# Patient Record
Sex: Male | Born: 1970 | Race: White | Hispanic: No | Marital: Single | Smoking: Current every day smoker
Health system: Southern US, Community
[De-identification: ages and names within clinical notes are randomized; demographics above are authoritative.]

---

## 1997-09-26 ENCOUNTER — Emergency Department (HOSPITAL_COMMUNITY): Admission: EM | Admit: 1997-09-26 | Discharge: 1997-09-26 | Payer: Self-pay | Admitting: Emergency Medicine

## 1997-10-07 ENCOUNTER — Ambulatory Visit (HOSPITAL_BASED_OUTPATIENT_CLINIC_OR_DEPARTMENT_OTHER): Admission: RE | Admit: 1997-10-07 | Discharge: 1997-10-07 | Payer: Self-pay | Admitting: Otolaryngology

## 1997-11-14 ENCOUNTER — Emergency Department (HOSPITAL_COMMUNITY): Admission: EM | Admit: 1997-11-14 | Discharge: 1997-11-14 | Payer: Self-pay | Admitting: Emergency Medicine

## 1998-12-10 ENCOUNTER — Encounter: Payer: Self-pay | Admitting: Emergency Medicine

## 1998-12-10 ENCOUNTER — Emergency Department (HOSPITAL_COMMUNITY): Admission: EM | Admit: 1998-12-10 | Discharge: 1998-12-10 | Payer: Self-pay | Admitting: Emergency Medicine

## 1998-12-12 ENCOUNTER — Emergency Department (HOSPITAL_COMMUNITY): Admission: EM | Admit: 1998-12-12 | Discharge: 1998-12-12 | Payer: Self-pay | Admitting: Emergency Medicine

## 1998-12-13 ENCOUNTER — Encounter: Payer: Self-pay | Admitting: Emergency Medicine

## 1998-12-13 ENCOUNTER — Ambulatory Visit (HOSPITAL_COMMUNITY): Admission: RE | Admit: 1998-12-13 | Discharge: 1998-12-13 | Payer: Self-pay | Admitting: Emergency Medicine

## 1999-12-23 ENCOUNTER — Inpatient Hospital Stay (HOSPITAL_COMMUNITY): Admission: EM | Admit: 1999-12-23 | Discharge: 1999-12-27 | Payer: Self-pay | Admitting: *Deleted

## 2000-01-03 ENCOUNTER — Emergency Department (HOSPITAL_COMMUNITY): Admission: EM | Admit: 2000-01-03 | Discharge: 2000-01-03 | Payer: Self-pay | Admitting: Emergency Medicine

## 2000-02-20 ENCOUNTER — Encounter: Payer: Self-pay | Admitting: Emergency Medicine

## 2000-02-20 ENCOUNTER — Emergency Department (HOSPITAL_COMMUNITY): Admission: EM | Admit: 2000-02-20 | Discharge: 2000-02-20 | Payer: Self-pay | Admitting: Emergency Medicine

## 2000-03-05 ENCOUNTER — Encounter: Payer: Self-pay | Admitting: Emergency Medicine

## 2000-03-05 ENCOUNTER — Emergency Department (HOSPITAL_COMMUNITY): Admission: EM | Admit: 2000-03-05 | Discharge: 2000-03-05 | Payer: Self-pay | Admitting: Emergency Medicine

## 2002-10-16 ENCOUNTER — Emergency Department (HOSPITAL_COMMUNITY): Admission: EM | Admit: 2002-10-16 | Discharge: 2002-10-16 | Payer: Self-pay | Admitting: Emergency Medicine

## 2002-10-16 ENCOUNTER — Encounter: Payer: Self-pay | Admitting: Emergency Medicine

## 2004-08-21 ENCOUNTER — Emergency Department (HOSPITAL_COMMUNITY): Admission: EM | Admit: 2004-08-21 | Discharge: 2004-08-21 | Payer: Self-pay | Admitting: Emergency Medicine

## 2005-03-11 ENCOUNTER — Emergency Department (HOSPITAL_COMMUNITY): Admission: EM | Admit: 2005-03-11 | Discharge: 2005-03-11 | Payer: Self-pay | Admitting: Family Medicine

## 2006-09-27 ENCOUNTER — Inpatient Hospital Stay (HOSPITAL_COMMUNITY): Admission: AC | Admit: 2006-09-27 | Discharge: 2006-09-28 | Payer: Self-pay

## 2007-01-16 ENCOUNTER — Emergency Department (HOSPITAL_COMMUNITY): Admission: EM | Admit: 2007-01-16 | Discharge: 2007-01-16 | Payer: Self-pay | Admitting: Emergency Medicine

## 2007-02-19 ENCOUNTER — Emergency Department (HOSPITAL_COMMUNITY): Admission: EM | Admit: 2007-02-19 | Discharge: 2007-02-19 | Payer: Self-pay | Admitting: Emergency Medicine

## 2007-04-20 ENCOUNTER — Emergency Department (HOSPITAL_COMMUNITY): Admission: EM | Admit: 2007-04-20 | Discharge: 2007-04-20 | Payer: Self-pay | Admitting: Emergency Medicine

## 2007-04-20 ENCOUNTER — Emergency Department (HOSPITAL_COMMUNITY): Admission: EM | Admit: 2007-04-20 | Discharge: 2007-04-21 | Payer: Self-pay | Admitting: Emergency Medicine
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
## 2010-07-06 NOTE — Discharge Summary (Signed)
[facility]  Patient:    [name], [name]                          MRN: [id number] Adm. Date:  [id number] Disc. Date: [id number] Attending:  Hilario Quarry Dictator:   Johnella Moloney, N.P.                           Discharge Summary  HISTORY OF PRESENT ILLNESS:  [name] is a 40 year old white married male voluntarily admitted to the [facility] on December 23, 1999 for depression and suicidal ideation.  The patient reported that he was having suicidal ideation.  He reports that he was drinking heavily on Thursday.  On his way home he got a DUI in the company car.  He was very depressed about this situation and was considering killing himself.  On the day of admission, the patient was in the woods and was considering killing shooting himself but felt because of his two sons that he could not this.  He reports his sleep has decreased over the past few nights, appetite good.  The patient does admit that he drinks on and off with months of sobriety.  Last drink was at 1 a.m. on Thursday night.  He has questionable history of DTs.  He reports some shaking episodes in the past, no seizures.  He does have a history of blackouts.  The patient is adamant about not starting on any medications for depression or his bipolar illness.  PAST PSYCHIATRIC HISTORY:  The patient has a history of bipolar and manic depression diagnosed four years ago.  He sees Dr. Bearl Mulberry in [place].  The patient was placed on medication; however he took himself off of it about a year ago because of the sexual side effects and he does not want to resume any type of medication at this time.  Past hospitalizations include [facility] and [facility] about three to five years ago for psychotic behavior.  PAST MEDICAL HISTORY:  Primary care physician: None.  Medical problems: None. Admission medications: None.  Allergies: No known drug allergies.  PHYSICAL EXAMINATION:  The patient  refused to have a physical examination on the day of admission.  MENTAL STATUS EXAMINATION ON ADMISSION:  Alert, young white male casually dressed, neat, pleasant, cooperative, good eye contact.  Speech: Normal and relevant.  Mood: Depressed.  Affect: Flat.  Thought processes: Intact with no evidence of psychosis.  Denied further suicidal ideation, negative homicidal ideation, no auditory or visual hallucinations, no flight of ideas, no delusions.  Cognitive: Intact.  Average intelligence.  Memory: Good. Judgment: Poor.  Insight: Fair.  Poor impulse control.  ADMITTING DIAGNOSES: Axis I:    Bipolar disorder with major depression. Axis II:   Deferred. Axis III:  None. Axis IV:   Moderate with occupational problems, problems related to the legal            system, and other psychosocial problems, history of alcohol use. Axis V:    Current global assessment of functioning 50, highest in the past            year 70.  LABORATORY DATA:  CBC with differential was within normal limits with the exception of RDW high at 14.3.  CMET: Within normal limits with the exception of albumin slightly decreased at 3.4, ALT high at 65, amylase elevated at 205. T4 and TSH were within normal limits,  T3 uptake was elevated at 40.3.  Urine drug screen was positive for marijuana.  Urinalysis was within normal limits.  HOSPITAL COURSE:  The patient was admitted to the [facility].  He was ordered Seroquel 25 mg p.o. q.4-6h. p.r.n. as needed, Blistex for his cold sore.  He was also ordered Librium 25 mg p.o. q.4h. p.r.n. for any signs and symptoms of withdrawal and also we added Seroquel 25 mg h.s. p.o.  While in the hospital, it was noted that he relapsed into drinking and that he got a DWI after two years of sobriety and this prompted this admission.  He had been stable on lithium and Depakote in the past but no longer wanted to take it and so he stopped his medication.  The second day, he was  doing better.  He slept good with the Seroquel.  He did report the Seroquel helped relax him.  There were no side effects noted.  Appetite was good.  He was having no withdrawal symptoms.  He denied depression.  He denied suicidal ideation or intent. Again, he said he did not want to take Depakote or lithium or a mood stabilizer.  He states his last episode of mania was four years ago.  He did acknowledge depression after drinking last Thursday; however, he feels like he is good shape.  He does not want a family session with anyone.  He prefers to deal with this on his own.  On November 7 he was doing well.  His mood were affect were bright, no withdrawal symptoms.  The family session was to occur November 8.  He does not believe he has a bipolar disorder and does not want to be on any mood stabilizing medication.  We continued the Seroquel as well as discussing his discharge.  On November 8 he was reporting doing well, had only mild withdrawal symptoms but appeared calm and euthymic, sleeping fairly well, no suicidal thoughts.  The patient was confronted about his marijuana use on the unit.  He said it was given to him by another patient.  He refused to identify the peer.  The patient was to have a marital session and then will be discharged after that.  It was felt like he could be managed on an outpatient basis and that he denied any suicidal or homicidal thoughts.  CONDITION ON DISCHARGE:  The patient was discharged in improved condition with improvement in mood, sleep, and appetite, no suicidal or homicidal ideation, no difficulty with energy.  DISPOSITION:  The patient was discharged home.  FOLLOWUP:  [facility], Wednesday, November 14 at 1:30 p.m. with Dr. Lang Snow and emergency services.  DISCHARGE MEDICATIONS:  Seroquel 25 mg on tablet at bedtime.  FINAL DIAGNOSES: Axis I:    1. Bipolar disorder.            2. Major depression. Axis II:    Deferred. Axis III:  None. Axis IV:   Moderate with occupational problems, problems related to the legal            system, and history of alcohol use.  Axis V:    Global assessment of functioning on discharge 59, highest in the            past year 70. DD:  01/30/00 TD:  01/30/00 Job: [id number]

## 2010-07-06 NOTE — H&P (Signed)
[facility]  Patient:    [name], [name]                             MRN: [id number] Adm. Date:  12/23/99 Attending:  Francis Dowse A. Claudette Head, M.D. Dictator:   Candi Leash. Theressa Stamps, N.P.                   Psychiatric Admission Assessment  DATE OF ADMISSION:  December 23, 1999.  PATIENT IDENTIFICATION:  This is a 40 year old white married male voluntarily admitted to [facility] on December 23, 1999 for depression and suicidal ideation.  HISTORY OF PRESENT ILLNESS:  This client is here for suicidal ideation.  The patient reports that he was drinking heavily on Thursday.  On his way home he got a DUI in the company car.  He was very depressed about this situation and was considering killing himself.  On the day of admission, the patient was in the woods and was considering shooting himself but he felt because of his two sons that he could not do this.  He reports his sleeping has been decreased over the past few nights.  His appetite has been good.  The patient does admit that he drinks on and off with months of sobriety.  His last drink was at 1 a.m. on Thursday night.  He has questionable history of DTs.  He reports some shaking episodes in the past, no seizures.  He does have history of blackouts and eye openers.  The patient is very adamant about not starting on any medications for depressions or his bipolar.  PAST PSYCHIATRIC HISTORY:  The patient has a history of bipolar manic depression diagnosed about four years ago.  He was on lithium and Depakote and he sees Dr. Bearl Mulberry in [place].  The patient had taken himself off his medications about a year ago because of sexual side effects and, as stated, the patient does not want to resume any type of medications at this time.  He has had hospitalizations at [facility] and at [facility] about three to five years ago for psychotic behavior.  PAST MEDICAL HISTORY:  Primary care Sendy Pluta: He has none.  Medical  problems: No medical problems.  Psychiatric problems: History of bipolar, as stated. The patient states that he takes care of his problems by diversional activities and using multivitamins and has been off medications for about a year.  Medications: None.  Drug allergies: None.  SOCIAL HISTORY:  This is a 40 year old white married male, married for the past three years.  He has two sons from a previous marriage.  He has a stepson that is 10 years old.  He is an Personnel officer.  He has a 10th grade education. He has no financial or legal problems although he did get a DUI on Thursday evening.  He lives with his wife and 32 year old and he sees his other two children every other weekend.  Family history: No psychiatric problems that he is aware of.  Alcohol and drug history: He has smoked one pack a day for about 10 years.  His alcohol habits include months of sobriety but on Thursday he states he drank about 20 beers and four glasses of wine and, again, received a DUI on his way home.  He does smoke marijuana about every three days.  PHYSICAL EXAMINATION:  Physical examination is pending.  Labs are pending: He will have a urinalysis, urine drug screen, CMET,  thyroid, CBC, and serum amylase to be drawn.  MENTAL STATUS EXAMINATION:  Alert, young white male casually dressed, neat, pleasant, cooperative, good eye contact.  Speech is normal and relevant.  Mood is depressed.  Affect is flat.  Thought processes are intact with no evidence of psychosis.  He denies any current suicidal ideation, negative homicidal ideation, negative auditory or visual hallucinations, negative flight of ideas, negative delusions.  Cognitively, he is intact x 3.  Average intelligence.  Memory is good.  Judgment is poor.  Insight is fair.  Poor impulse control.  ADMISSION DIAGNOSES: Axis I:    Bipolar disorder with major depression. Axis II:   Deferred. Axis III:  None. Axis IV:   Moderate with occupational  problems, problems related to the legal            system, other psychosocial problems, history of alcohol use. Axis V:    Current is 50, past year is 16.  INITIAL PLAN OF CARE:  Voluntary admission to [facility] for depression and suicidal ideation.  The patient is to contract for safety. Check every 15 minutes.  The patient was started on Seroquel 25 mg q.4-6h. His labs are pending.  Routing standing medications will be initiated.  ESTIMATED LENGTH OF STAY:  Three to four days. DD:  12/24/99 TD:  12/24/99 Job: [id number]

## 2010-12-03 LAB — CBC
HCT: 45.1
HCT: 46.5
Hemoglobin: 15.6
Hemoglobin: 15.9
MCHC: 34.7
MCV: 90.2
MCV: 90.7
Platelets: 281
Platelets: 321
RBC: 5
RBC: 5.13
RDW: 14.1 — ABNORMAL HIGH
WBC: 15.9 — ABNORMAL HIGH

## 2010-12-03 LAB — I-STAT 8, (EC8 V) (CONVERTED LAB)
Acid-Base Excess: 1
BUN: 11
Bicarbonate: 26.8 — ABNORMAL HIGH
Chloride: 103
Glucose, Bld: 129 — ABNORMAL HIGH
HCT: 52
Hemoglobin: 17.7 — ABNORMAL HIGH
Operator id: 284251
Potassium: 3.4 — ABNORMAL LOW
Sodium: 137
TCO2: 28
pCO2, Ven: 43.6 — ABNORMAL LOW
pH, Ven: 7.397 — ABNORMAL HIGH

## 2010-12-03 LAB — TYPE AND SCREEN
ABO/RH(D): A POS
Antibody Screen: NEGATIVE

## 2010-12-03 LAB — PROTIME-INR
INR: 0.9
Prothrombin Time: 12.7

## 2012-08-05 ENCOUNTER — Ambulatory Visit: Payer: Self-pay | Admitting: Family Medicine

## 2012-08-05 VITALS — BP 151/83 | HR 79 | Temp 98.0°F | Resp 18 | Ht 72.0 in | Wt 205.0 lb

## 2012-08-05 DIAGNOSIS — M436 Torticollis: Secondary | ICD-10-CM

## 2012-08-05 MED ORDER — PREDNISONE 20 MG PO TABS: ORAL_TABLET | ORAL | Status: DC

## 2012-08-05 MED ORDER — HYDROCODONE-ACETAMINOPHEN 5-325 MG PO TABS: 1.0000 | ORAL_TABLET | Freq: Four times a day (QID) | ORAL | Status: DC | PRN

## 2012-08-05 NOTE — Patient Instructions (Signed)
Torticollis, Acute °You have suddenly (acutely) developed a twisted neck (torticollis). This is usually a self-limited condition. °CAUSES  °Acute torticollis may be caused by malposition, trauma or infection. Most commonly, acute torticollis is caused by sleeping in an awkward position. Torticollis may also be caused by the flexion, extension or twisting of the neck muscles beyond their normal position. Sometimes, the exact cause may not be known. °SYMPTOMS  °Usually, there is pain and limited movement of the neck. Your neck may twist to one side. °DIAGNOSIS  °The diagnosis is often made by physical examination. X-rays, CT scans or MRIs may be done if there is a history of trauma or concern of infection. °TREATMENT  °For a common, stiff neck that develops during sleep, treatment is focused on relaxing the contracted neck muscle. Medications (including shots) may be used to treat the problem. Most cases resolve in several days. Torticollis usually responds to conservative physical therapy. If left untreated, the shortened and spastic neck muscle can cause deformities in the face and neck. Rarely, surgery is required. °HOME CARE INSTRUCTIONS  °· Use over-the-counter and prescription medications as directed by your caregiver. °· Do stretching exercises and massage the neck as directed by your caregiver. °· Follow up with physical therapy if needed and as directed by your caregiver. °SEEK IMMEDIATE MEDICAL CARE IF:  °· You develop difficulty breathing or noisy breathing (stridor). °· You drool, develop trouble swallowing or have pain with swallowing. °· You develop numbness or weakness in the hands or feet. °· You have changes in speech or vision. °· You have problems with urination or bowel movements. °· You have difficulty walking. °· You have a fever. °· You have increased pain. °MAKE SURE YOU:  °· Understand these instructions. °· Will watch your condition. °· Will get help right away if you are not doing well or  get worse. °Document Released: 02/02/2000 Document Revised: 04/29/2011 Document Reviewed: 03/15/2009 °[employer]® Patient Information ©2014 [employer]. ° °
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
## 2012-08-07 ENCOUNTER — Ambulatory Visit: Payer: Self-pay

## 2012-08-07 ENCOUNTER — Ambulatory Visit: Payer: Self-pay | Admitting: Family Medicine

## 2012-08-07 ENCOUNTER — Telehealth: Payer: Self-pay

## 2012-08-07 VITALS — BP 122/75 | HR 70 | Temp 98.5°F | Resp 16 | Ht 73.0 in | Wt 210.0 lb

## 2012-08-07 DIAGNOSIS — M542 Cervicalgia: Secondary | ICD-10-CM

## 2012-08-07 DIAGNOSIS — M436 Torticollis: Secondary | ICD-10-CM

## 2012-08-07 MED ORDER — PREDNISONE 20 MG PO TABS: ORAL_TABLET | ORAL | Status: DC

## 2012-08-07 MED ORDER — HYDROCODONE-ACETAMINOPHEN 5-325 MG PO TABS: 1.0000 | ORAL_TABLET | Freq: Four times a day (QID) | ORAL | Status: DC | PRN

## 2012-08-07 MED ORDER — HYDROCODONE-ACETAMINOPHEN 7.5-325 MG PO TABS: 1.0000 | ORAL_TABLET | Freq: Four times a day (QID) | ORAL | Status: DC | PRN

## 2012-08-07 NOTE — Telephone Encounter (Signed)
Thanks, I have called patient to advise. Left message for him to advise.

## 2012-08-07 NOTE — Telephone Encounter (Signed)
[name] would like to know how long his neck pain should last he is still in a lot of pain from his visit the other day please call him at [phone]

## 2012-08-07 NOTE — Telephone Encounter (Signed)
I had expected some improvement by now.  Have patient return for x-rays
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
## 2012-08-07 NOTE — Telephone Encounter (Signed)
Please advise 

## 2014-02-21 ENCOUNTER — Emergency Department (HOSPITAL_COMMUNITY)
Admission: EM | Admit: 2014-02-21 | Discharge: 2014-02-21 | Disposition: A | Discharge status: Home / Self Care | Payer: BLUE CROSS/BLUE SHIELD | Attending: Emergency Medicine | Admitting: Emergency Medicine

## 2014-02-21 ENCOUNTER — Emergency Department (HOSPITAL_COMMUNITY): Payer: BLUE CROSS/BLUE SHIELD

## 2014-02-21 ENCOUNTER — Encounter (HOSPITAL_COMMUNITY): Payer: Self-pay | Admitting: *Deleted

## 2014-02-21 DIAGNOSIS — Z7952 Long term (current) use of systemic steroids: Secondary | ICD-10-CM | POA: Insufficient documentation

## 2014-02-21 DIAGNOSIS — S61219A Laceration without foreign body of unspecified finger without damage to nail, initial encounter: Secondary | ICD-10-CM

## 2014-02-21 DIAGNOSIS — S61212A Laceration without foreign body of right middle finger without damage to nail, initial encounter: Secondary | ICD-10-CM | POA: Diagnosis present

## 2014-02-21 DIAGNOSIS — F1010 Alcohol abuse, uncomplicated: Secondary | ICD-10-CM | POA: Insufficient documentation

## 2014-02-21 DIAGNOSIS — S60512A Abrasion of left hand, initial encounter: Secondary | ICD-10-CM | POA: Insufficient documentation

## 2014-02-21 DIAGNOSIS — Y998 Other external cause status: Secondary | ICD-10-CM | POA: Diagnosis not present

## 2014-02-21 DIAGNOSIS — S199XXA Unspecified injury of neck, initial encounter: Secondary | ICD-10-CM | POA: Diagnosis not present

## 2014-02-21 DIAGNOSIS — Y9289 Other specified places as the place of occurrence of the external cause: Secondary | ICD-10-CM | POA: Insufficient documentation

## 2014-02-21 DIAGNOSIS — Y9389 Activity, other specified: Secondary | ICD-10-CM | POA: Diagnosis not present

## 2014-02-21 DIAGNOSIS — S60511A Abrasion of right hand, initial encounter: Secondary | ICD-10-CM | POA: Diagnosis not present

## 2014-02-21 DIAGNOSIS — G4700 Insomnia, unspecified: Secondary | ICD-10-CM | POA: Insufficient documentation

## 2014-02-21 DIAGNOSIS — Z008 Encounter for other general examination: Secondary | ICD-10-CM | POA: Insufficient documentation

## 2014-02-21 DIAGNOSIS — Z720 Tobacco use: Secondary | ICD-10-CM | POA: Diagnosis not present

## 2014-02-21 DIAGNOSIS — Z23 Encounter for immunization: Secondary | ICD-10-CM | POA: Insufficient documentation

## 2014-02-21 DIAGNOSIS — Z79899 Other long term (current) drug therapy: Secondary | ICD-10-CM | POA: Diagnosis not present

## 2014-02-21 DIAGNOSIS — R52 Pain, unspecified: Secondary | ICD-10-CM

## 2014-02-21 DIAGNOSIS — F1011 Alcohol abuse, in remission: Secondary | ICD-10-CM

## 2014-02-21 DIAGNOSIS — M542 Cervicalgia: Secondary | ICD-10-CM

## 2014-02-21 MED ORDER — TETANUS-DIPHTH-ACELL PERTUSSIS 5-2.5-18.5 LF-MCG/0.5 IM SUSP
0.5000 mL | Freq: Once | INTRAMUSCULAR | Status: AC
  Administered 2014-02-21: 0.5 mL via INTRAMUSCULAR
  Filled 2014-02-21: qty 0.5

## 2014-02-21 MED ORDER — AMOXICILLIN-POT CLAVULANATE 875-125 MG PO TABS
1.0000 | ORAL_TABLET | Freq: Once | ORAL | Status: AC
  Administered 2014-02-21: 1 via ORAL
  Filled 2014-02-21: qty 1

## 2014-02-21 MED ORDER — METHOCARBAMOL 500 MG PO TABS
1000.0000 mg | ORAL_TABLET | Freq: Four times a day (QID) | ORAL | Status: DC | PRN
Start: 2014-02-21 — End: 2014-03-07

## 2014-02-21 MED ORDER — RAMELTEON 8 MG PO TABS: 8.0000 mg | ORAL_TABLET | Freq: Every evening | ORAL | Status: DC | PRN

## 2014-02-21 MED ORDER — CEPHALEXIN 250 MG PO CAPS
500.0000 mg | ORAL_CAPSULE | Freq: Once | ORAL | Status: AC
  Administered 2014-02-21: 500 mg via ORAL
  Filled 2014-02-21: qty 2

## 2014-02-21 MED ORDER — IBUPROFEN 400 MG PO TABS
400.0000 mg | ORAL_TABLET | Freq: Once | ORAL | Status: AC
  Administered 2014-02-21: 400 mg via ORAL
  Filled 2014-02-21: qty 1

## 2014-02-21 MED ORDER — AMOXICILLIN-POT CLAVULANATE 875-125 MG PO TABS: 1.0000 | ORAL_TABLET | Freq: Two times a day (BID) | ORAL | Status: DC
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
## 2014-02-21 NOTE — Discharge Instructions (Signed)
Wash the affected area with soap and water and apply a thin layer of topical antibiotic ointment. Do this every 12 hours.   Do not use rubbing alcohol or hydrogen peroxide.                        Look for signs of infection: if you see redness, if the area becomes warm, if pain increases sharply, there is discharge (pus), if red streaks appear or you develop fever or vomiting, RETURN immediately to the Emergency Department  for a recheck.   Take your antibiotics as directed and to completion. You should never have any leftover antibiotics! Push fluids and stay well hydrated.    Do not hesitate to return to the emergency room for any new, worsening or concerning symptoms.  Please obtain primary care using resource guide below. But the minute you were seen in the emergency room and that they will need to obtain records for further outpatient management.   Emergency Department Resource Guide 1) Find a Doctor and Pay Out of Pocket Although you won't have to find out who is covered by your insurance plan, it is a good idea to ask around and get recommendations. You will then need to call the office and see if the doctor you have chosen will accept you as a new patient and what types of options they offer for patients who are self-pay. Some doctors offer discounts or will set up payment plans for their patients who do not have insurance, but you will need to ask so you aren't surprised when you get to your appointment.  2) Contact Your Local Health Department Not all health departments have doctors that can see patients for sick visits, but many do, so it is worth a call to see if yours does. If you don't know where your local health department is, you can check in your phone book. The CDC also has a tool to help you locate your state's health department, and many state websites also have listings of all of their local health departments.  3) Find a Walk-in Clinic If your illness is not likely to be  very severe or complicated, you may want to try a walk in clinic. These are popping up all over the country in pharmacies, drugstores, and shopping centers. They're usually staffed by nurse practitioners or physician assistants that have been trained to treat common illnesses and complaints. They're usually fairly quick and inexpensive. However, if you have serious medical issues or chronic medical problems, these are probably not your best option.  No Primary Care Doctor: - Call Health Connect at  [phone] - they can help you locate a primary care doctor that  accepts your insurance, provides certain services, etc. - Physician Referral Service- [phone]  Chronic Pain Problems: Organization         Address  Phone   Notes  [facility]  [phone] Patients need to be referred by their primary care doctor.   Medication Assistance: Organization         Address  Phone   Notes  [facility] Medication [facility] [address] [place], [place] [address] [phone] --Must be a resident of [facility] -- Must have NO insurance coverage whatsoever (no Medicaid/ Medicare, etc.) -- The pt. MUST have a primary care doctor that directs their care regularly and follows them in the community   MedAssist  [phone]   [address]  [phone]    Agencies that provide inexpensive medical care: Organization         Address  Phone   Notes  [place]  [phone]   [facility]    [phone]   [facility] [place], [place] [address] [phone]   [place] [address], [place] [phone]   Planned Parenthood    [phone]   [facility]    [phone]   [place] and [address], [place] Phone:  [phone], Fax:  [phone] Hours of Operation:  9 am - 6 pm, M-F.  Also  accepts Medicaid/Medicare and self-pay.  [facility] for [place] [address], [address], [place] Phone: [phone], Fax: [phone]. Hours of Operation:  8:30 am - 5:30 pm, M-F.  Also accepts Medicaid and self-pay.  [facility] [place] [address] [place] Phone: [phone]   [place], [place], [place] [phone], Ext. [phone] Mondays & Thursdays: 7-9 AM.  First 15 patients are seen on a first come, first serve basis.    [place] Providers:  Organization         Address  Phone   Notes  [facility] [address],  [phone] Also accepts self-pay patients.  [facility] [address], [place]  [phone]   [place], [address], [place] [phone]   [facility] [address] [place] [phone]   Lucianne Lei [address], [place]   [phone] Only accepts [place] Access [place] patients after they have their name applied to their card.   Self-Pay (no insurance) in [facility]:  Organization         Address  Phone   Notes  Sickle Cell Patients, [facility] Internal [facility] [place] [phone]   [facility] Urgent Care [place] [phone]   [facility] [place]  [place], [place], [place] [phone]   [facility]/Dr. Osei-Bonsu  [address], [place] or [address], [place] [phone] Phone number for both [place] and [place] locations is the same.  Urgent Medical and [facility] [address] [place] [phone]   [facility] [address] [place] or [address] [phone]   [facility] [address]  [place] [phone], phone; [phone], fax Sees patients 1st and 3rd Saturday of every month.  Must not qualify for public or private insurance (i.e. Medicaid, Medicare, [place] Health Choice, Veterans' Benefits)  Household income should be no more than 200% of the poverty level The clinic cannot treat you if you are pregnant or think you are pregnant  Sexually transmitted diseases are not treated at the clinic.    Dental Care: Organization         Address  Phone  Notes  [facility] Department of [facility] [place] [phone] Accepts children up to age 35 who are enrolled in [place] or [place]; pregnant women with a Medicaid card; and  children who have applied for Medicaid or [place] Health Choice, but were declined, whose parents can pay a reduced fee at time of service.  [facility] Department of [facility]  [address], [place] [phone] Accepts children up to age 69 who are enrolled in [place] or [place]; pregnant women with a Medicaid card; and children who have applied for Medicaid or [place] Health Choice, but were declined, whose parents can pay a reduced fee at time of service.  [facility] Access PRO[facility]  [place] [phone] Patients are seen by appointment only. Walk-ins are not accepted. [place] will see patients 48 years of age and older. Monday - Tuesday (8am-5pm) Most Wednesdays (8:30-5pm) $30 per visit, cash only  [facility] Adult [facility] Access PROGRAM  [address], [facility] [place] [phone] Patients are seen by appointment only. Walk-ins are not accepted. [place] will see patients 6 years of age and older. One Wednesday Evening (Monthly: Volunteer Based).  $30 per visit, cash only  [place]  [phone] for adults; Children under age 63, call Graduate Pediatric Dentistry at [phone].  Children aged 35-14, please call [phone] to request a pediatric application.  Dental services are provided in all areas of dental care including fillings, crowns and bridges, complete and partial dentures, implants, gum treatment, root canals, and extractions. Preventive care is also provided. Treatment is provided to both adults and children. Patients are selected via a lottery and there is often a waiting list.   [facility] [address], [place]  [phone] [email or web address]   [facility] [address] [place], [place] [phone], Ext. [phone] Second and Fourth Thursday of each month, opens at 6:30 AM; Clinic ends at 9 AM.  Patients are seen on a first-come first-served basis, and a limited number are seen during each clinic.   [facility]  [address] Hillard Danker [place], [place] [phone]   Eligibility Requirements You must have lived in [place], [place], or [place] counties for at least the last three months.   You cannot be eligible for state or federal sponsored [employer], including [employer], [place], or [employer].   You generally cannot be eligible for healthcare insurance through your employer.    How to apply: Eligibility screenings are held every Tuesday and Wednesday afternoon from 1:00 pm until 4:00 pm. You do not need an appointment for the interview!  [facility] [address] [place], [place]   [place]  [place] Department  [place]  [phone]    Behavioral Health Resources in the Community: Intensive Outpatient Programs Organization         Address  Phone  Notes  [place] [place] [place], [place], [place] [phone]   [facility] [address] [place], [place]   ADS: Alcohol & Drug Svcs [address], [place], [place]   [place] [address]  [place], [place] or [phone]   Substance Abuse Resources Organization         Address  Phone  Notes  Alcohol and Drug Services  [phone]   [facility]  [phone]   [facility] [place]   [facility]  [phone]   Residential &  Outpatient Substance Abuse Program  [phone]   Psychological Services Organization         Address  Phone  Notes  [facility] Behavioral Health  Agenda  [phone]   [place] [address] [place] or [phone]    Mobile Crisis Teams Organization         Address  Phone  Notes  [facility], Mobile Crisis Care Unit  [phone]   [facility]  [address] [place], [place]   [place] [address] [place] [phone]    Self-Help/Support Groups Organization         Address  Phone             Notes  [place]. of [place] - variety of support groups  Cataract Call for more information  Narcotics Anonymous (NA), [facility] [address], [employer] [place]  2 meetings at this location   Special educational needs teacher         Address  Phone  Notes  [facility] Residential Treatment [place],    [place]  [phone]   [facility]  [address], [place] [phone], [place], [place]   [place], [place] [phone] Admissions: 8am-3pm M-F  Incentives Substance [place] [address],    [place], [place] [phone]   [facility] [address] [place], [place], [place]   [facility] [address],  [place], [place]   [facility] - Intensive Outpatient [address], [place], [place]     [facility] [address]  [place], [place] [phone] or [phone]   [facility] Services (RTS) [address] [place], [place] Accepts Medicaid  [facility] [place] [address]  [place] [phone] Substance Abuse/Addiction Treatment   [facility] Organization         Address  Phone  Notes  [facility]  [phone]   Domenic Schwab, PhD [address] Arlis [place], [place]   [phone] or [phone]   [place], [place] [phone]   [facility] [address], [place], [place] [phone] Insurance/Medicaid/sponsorship through [facility] and Families [address] [place]                                    [place], [place] [phone] [place] [address], [place] [phone]    Dr. Adele Schilder  [phone]   [facility] Dept. 1) [address], [place] 2) [place] 3)  [place] [address], [place] [phone]  [phone]   [place] [phone] or [phone] (After Hours)

## 2014-02-21 NOTE — ED Notes (Signed)
Pt reports being a recovered alcoholic but relapsed last night, drank 10 beers. Pt reports high stress level and states that " he just needs a few days of rest." denies SI or HI.

## 2014-03-07 ENCOUNTER — Emergency Department (HOSPITAL_COMMUNITY)
Admission: EM | Admit: 2014-03-07 | Discharge: 2014-03-07 | Disposition: A | Discharge status: Home / Self Care | Payer: BLUE CROSS/BLUE SHIELD | Attending: Emergency Medicine | Admitting: Emergency Medicine

## 2014-03-07 ENCOUNTER — Encounter (HOSPITAL_COMMUNITY): Payer: Self-pay | Admitting: *Deleted

## 2014-03-07 DIAGNOSIS — L0211 Cutaneous abscess of neck: Secondary | ICD-10-CM | POA: Insufficient documentation

## 2014-03-07 DIAGNOSIS — S161XXA Strain of muscle, fascia and tendon at neck level, initial encounter: Secondary | ICD-10-CM

## 2014-03-07 DIAGNOSIS — L0291 Cutaneous abscess, unspecified: Secondary | ICD-10-CM

## 2014-03-07 DIAGNOSIS — Y9389 Activity, other specified: Secondary | ICD-10-CM | POA: Insufficient documentation

## 2014-03-07 DIAGNOSIS — L02212 Cutaneous abscess of back [any part, except buttock]: Secondary | ICD-10-CM | POA: Insufficient documentation

## 2014-03-07 DIAGNOSIS — Y9289 Other specified places as the place of occurrence of the external cause: Secondary | ICD-10-CM | POA: Diagnosis not present

## 2014-03-07 DIAGNOSIS — Z720 Tobacco use: Secondary | ICD-10-CM | POA: Insufficient documentation

## 2014-03-07 DIAGNOSIS — Y998 Other external cause status: Secondary | ICD-10-CM | POA: Diagnosis not present

## 2014-03-07 DIAGNOSIS — X58XXXA Exposure to other specified factors, initial encounter: Secondary | ICD-10-CM | POA: Diagnosis not present

## 2014-03-07 DIAGNOSIS — S199XXA Unspecified injury of neck, initial encounter: Secondary | ICD-10-CM | POA: Diagnosis present

## 2014-03-07 MED ORDER — PREDNISONE 10 MG PO TABS
20.0000 mg | ORAL_TABLET | Freq: Every day | ORAL | Status: AC
Start: 2014-03-07 — End: ?

## 2014-03-07 MED ORDER — CYCLOBENZAPRINE HCL 10 MG PO TABS: 10.0000 mg | ORAL_TABLET | Freq: Two times a day (BID) | ORAL | Status: DC | PRN

## 2014-03-07 MED ORDER — SULFAMETHOXAZOLE-TRIMETHOPRIM 800-160 MG PO TABS: 1.0000 | ORAL_TABLET | Freq: Two times a day (BID) | ORAL | Status: AC

## 2014-03-07 NOTE — ED Notes (Addendum)
Pt states that he was moving rocks in his yard. Pt states that he now has neck and shoulder pain. Pt states that this started several hours after moving the rocks. Pt is requesting a refill on his robaxin augmentin as well.

## 2014-03-07 NOTE — ED Provider Notes (Signed)
CSN: [id number]     Arrival date & time 03/07/14  1717 History  This chart was scribed for non-physician practitioner, Kerrie Buffalo, FNP working with Rolland Porter, MD by Greggory Stallion, ED scribe. This patient was seen in room TR05C/TR05C and the patient's care was started at 7:11 PM.   Chief Complaint  Patient presents with  . Neck Injury   The history is provided by the patient. No language interpreter was used.    HPI Comments: [name] is a 44 y.o. male who presents to the Emergency Department complaining of neck pain that radiates into his shoulders that started earlier tonight. States he was moving rocks in his yard several hours before the pain started. Certain movements worsen pain. Pt has used a heating pad and taken ibuprofen with no relief. Reports history of neck surgery. Pt is requesting PCP referrals.  History reviewed. No pertinent past medical history. History reviewed. No pertinent past surgical history. No family history on file. History  Substance Use Topics  . Smoking status: Current Every Day Smoker    Types: Cigarettes  . Smokeless tobacco: Not on file  . Alcohol Use: Yes     Comment: heavy    Review of Systems  Musculoskeletal: Positive for neck pain.  Skin:       Abscess back of neck  All other systems reviewed and are negative.  Allergies  Review of patient's allergies indicates no known allergies.  Home Medications   Prior to Admission medications   Medication Sig Start Date End Date Taking? Authorizing Provider  sildenafil (VIAGRA) 100 MG tablet Take 100 mg by mouth daily as needed for erectile dysfunction.   Yes Historical Provider, MD  cyclobenzaprine (FLEXERIL) 10 MG tablet Take 1 tablet (10 mg total) by mouth 2 (two) times daily as needed for muscle spasms. 03/07/14   Draper Gallon Orlene Och, NP  predniSONE (DELTASONE) 10 MG tablet Take 2 tablets (20 mg total) by mouth daily. 03/07/14   Kaleab Frasier Orlene Och, NP  ramelteon (ROZEREM) 8 MG tablet Take 1 tablet (8 mg  total) by mouth at bedtime as needed for sleep. Patient not taking: Reported on 03/07/2014 02/21/14   Joni Reining Pisciotta, PA-C  sulfamethoxazole-trimethoprim (SEPTRA DS) 800-160 MG per tablet Take 1 tablet by mouth every 12 (twelve) hours. 03/07/14   Kevin Space Orlene Och, NP   BP 156/85 mmHg  Pulse 90  Temp(Src) 97.8 F (36.6 C) (Oral)  Resp 16  SpO2 98%   Physical Exam  Constitutional: He is oriented to person, place, and time. He appears well-developed and well-nourished. No distress.  HENT:  Head: Normocephalic and atraumatic.  Eyes: EOM are normal. Pupils are equal, round, and reactive to light.  Neck: Normal range of motion. Neck supple.  Cardiovascular: Normal rate and regular rhythm.   Pulmonary/Chest: Effort normal. No respiratory distress. He has no wheezes. He has no rales.  Abdominal: Soft. Bowel sounds are normal. There is no tenderness.  Musculoskeletal: He exhibits no edema.       Cervical back: He exhibits decreased range of motion (due to pain), tenderness and spasm.       Back:  No C-spine tenderness. Tenderness and spasm over sternocleidomastoid muscles.   Neurological: He is alert and oriented to person, place, and time. He has normal strength. No cranial nerve deficit or sensory deficit. Coordination and gait normal.  Reflex Scores:      Bicep reflexes are 2+ on the right side and 2+ on the left side.  Brachioradialis reflexes are 2+ on the right side and 2+ on the left side.      Patellar reflexes are 2+ on the right side and 2+ on the left side.      Achilles reflexes are 2+ on the right side and 2+ on the left side. Skin: Skin is warm and dry.  2 small raised areas to the back of his neck with appearance of early abscesses.  Psychiatric: He has a normal mood and affect. His behavior is normal.  Nursing note and vitals reviewed.   ED Course  Procedures (including critical care time)  DIAGNOSTIC STUDIES: Oxygen Saturation is 96% on RA, normal by my interpretation.     COORDINATION OF CARE: 7:16 PM-Discussed treatment plan which includes a muscle relaxer and prednisone with pt at bedside and pt agreed to plan. Will give pt a PCP referral and advised him to follow up.   Labs Review  MDM  44 y.o. male with muscle spasm due to injury and tender raised red areas to the posterior aspect of his neck. Stable for discharge without neuro deficits. Will treat for muscle spasm and abscess. Discussed with the patient and all questioned fully answered. He will return if any problems arise.  Final diagnoses:  Strain of neck muscle, initial encounter  Abscess   I personally performed the services described in this documentation, which was scribed in my presence. The recorded information has been reviewed and is accurate.  [address]urn Knaus River RoadM Kaynen Minner, NP 03/08/14 [id number]  Rolland PorterMark James, MD 03/12/14 [phone]

## 2014-03-07 NOTE — ED Notes (Signed)
Called pt name x2, no answer. Informed pt went outside to smoke.

## 2014-03-07 NOTE — ED Notes (Signed)
Pt. Left with all belongings and refused wheelchair 
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
## 2015-11-28 ENCOUNTER — Encounter (HOSPITAL_COMMUNITY): Payer: Self-pay | Admitting: Emergency Medicine

## 2015-11-28 ENCOUNTER — Emergency Department (HOSPITAL_COMMUNITY)
Admission: EM | Admit: 2015-11-28 | Discharge: 2015-11-28 | Disposition: A | Discharge status: Home / Self Care | Payer: BLUE CROSS/BLUE SHIELD | Attending: Emergency Medicine | Admitting: Emergency Medicine

## 2015-11-28 DIAGNOSIS — H6690 Otitis media, unspecified, unspecified ear: Secondary | ICD-10-CM

## 2015-11-28 DIAGNOSIS — Y999 Unspecified external cause status: Secondary | ICD-10-CM | POA: Insufficient documentation

## 2015-11-28 DIAGNOSIS — F17210 Nicotine dependence, cigarettes, uncomplicated: Secondary | ICD-10-CM | POA: Insufficient documentation

## 2015-11-28 DIAGNOSIS — Z79899 Other long term (current) drug therapy: Secondary | ICD-10-CM | POA: Insufficient documentation

## 2015-11-28 DIAGNOSIS — T161XXA Foreign body in right ear, initial encounter: Secondary | ICD-10-CM | POA: Insufficient documentation

## 2015-11-28 DIAGNOSIS — H6691 Otitis media, unspecified, right ear: Secondary | ICD-10-CM | POA: Insufficient documentation

## 2015-11-28 DIAGNOSIS — Y939 Activity, unspecified: Secondary | ICD-10-CM | POA: Insufficient documentation

## 2015-11-28 DIAGNOSIS — H6121 Impacted cerumen, right ear: Secondary | ICD-10-CM | POA: Insufficient documentation

## 2015-11-28 DIAGNOSIS — X58XXXA Exposure to other specified factors, initial encounter: Secondary | ICD-10-CM | POA: Insufficient documentation

## 2015-11-28 DIAGNOSIS — Y929 Unspecified place or not applicable: Secondary | ICD-10-CM | POA: Insufficient documentation

## 2015-11-28 MED ORDER — AMOXICILLIN-POT CLAVULANATE 875-125 MG PO TABS: 1.0000 | ORAL_TABLET | Freq: Two times a day (BID) | ORAL | 0 refills | Status: DC

## 2015-11-28 MED ORDER — HYDROCODONE-ACETAMINOPHEN 5-325 MG PO TABS
2.0000 | ORAL_TABLET | Freq: Once | ORAL | Status: AC
  Administered 2015-11-28: 2 via ORAL
  Filled 2015-11-28: qty 2

## 2015-11-28 NOTE — ED Triage Notes (Signed)
Patient reports he has a "bad ear infection." [facility]. He was put on amoxicillin and his right ear was irrigated. However, patient is still having right ear pain and difficulty hearing out of his right ear.

## 2015-11-28 NOTE — Discharge Instructions (Signed)
Medications: Augmentin  Treatment: Stop taking your amoxicillin. Begin taking Augmentin twice daily for 1 week. You can take up to 800 mg of ibuprofen 3 times daily for your pain. Do not exceed this and do not take longer than 7 days in this manner. To break up earwax in the future you can use 2 drops of olive oil in your ear before bedtime or you can use one partner water, one part hydrogen peroxide and let sit for 10-15 minutes and turning your head to let water and wax spill out.  Follow-up: Please follow-up with Dr. Jearld FentonByers, an ENT doctor, for further evaluation and treatment if your symptoms are not improving. Please return to emergency department if you develop any new or worsening symptoms, including pain behind her ear, pain moving yout ear, high fevers, or any other concerning symptoms.

## 2015-11-28 NOTE — ED Provider Notes (Signed)
[facility]-EMERGENCY DEPT Provider Note   CSN: [id number] Arrival date & time: 11/28/15  1218   By signing my name below, I, Teofilo PodMatthew P. Jamison, attest that this documentation has been prepared under the direction and in the presence of Buel ReamAlexandra Seanne Chirico, PA-C. Electronically Signed: Teofilo PodMatthew P. Jamison, ED Scribe. 11/28/2015. 1:22 PM.   History   Chief Complaint Chief Complaint  Patient presents with  . Otalgia    The history is provided by the patient. No language interpreter was used.   HPI Comments:  [name] is a 45 y.o. male who presents to the Emergency Department complaining of worsening pain to his right ear x3 days. Pt complains of associated difficulty hearing in his right ear. Pt reports that the generalized area of his head surrounding his right ear began hurting this morning with pain radiating to the right side of the jaw. Pt notes that the jaw pain is not as severe as it was earlier today. Pt reports that he was seen at [facility] to receive treatment 3 days ago. Pt states that he was found to have significant cerumen impaction in his right ear. Pt was given amoxicillin and had his ear irrigated at [facility] with no relief. Pt took ibuprofen at 10AM this morning for the pain with mild, temporary relief. Pt denies other associated symptoms, including fever.   History reviewed. No pertinent past medical history.  There are no active problems to display for this patient.   History reviewed. No pertinent surgical history.     Home Medications    Prior to Admission medications   Medication Sig Start Date End Date Taking? Authorizing Provider  amoxicillin-clavulanate (AUGMENTIN) 875-125 MG tablet Take 1 tablet by mouth 2 (two) times daily. 11/28/15   Emi HolesAlexandra M Felipa Laroche, PA-C  cyclobenzaprine (FLEXERIL) 10 MG tablet Take 1 tablet (10 mg total) by mouth 2 (two) times daily as needed for muscle spasms. 03/07/14   Hope Orlene OchM Neese, NP  predniSONE (DELTASONE) 10 MG tablet Take 2 tablets (20 mg  total) by mouth daily. 03/07/14   Hope Orlene OchM Neese, NP  ramelteon (ROZEREM) 8 MG tablet Take 1 tablet (8 mg total) by mouth at bedtime as needed for sleep. Patient not taking: Reported on 03/07/2014 02/21/14   Joni ReiningNicole Pisciotta, PA-C  sildenafil (VIAGRA) 100 MG tablet Take 100 mg by mouth daily as needed for erectile dysfunction.    Historical Provider, MD  sulfamethoxazole-trimethoprim (SEPTRA DS) 800-160 MG per tablet Take 1 tablet by mouth every 12 (twelve) hours. 03/07/14   Hope Orlene OchM Neese, NP    Family History No family history on file.  Social History Social History  Substance Use Topics  . Smoking status: Current Every Day Smoker    Types: Cigarettes  . Smokeless tobacco: Never Used  . Alcohol use Yes     Comment: heavy     Allergies   Review of patient's allergies indicates no known allergies.   Review of Systems Review of Systems  Constitutional: Negative for fever.  HENT: Positive for ear pain. Negative for ear discharge.        Positive for decreased right ear hearing and right-sided jaw pain.  Respiratory: Negative for shortness of breath.   Cardiovascular: Negative for chest pain.  Gastrointestinal: Negative for abdominal pain, nausea and vomiting.  Musculoskeletal: Negative for neck pain.  Skin: Negative for rash and wound.     Physical Exam Updated Vital Signs BP 117/84 (BP Location: Left Arm)   Pulse 64   Temp 98  F (36.7 C) (Oral)   Resp 18   Ht 6' (1.829 m)   Wt 91.2 kg   SpO2 95%   BMI 27.26 kg/m   Physical Exam  Constitutional: He appears well-developed and well-nourished. No distress.  HENT:  Head: Normocephalic and atraumatic.  Right Ear: A foreign body (cotton) is present. No mastoid tenderness. Tympanic membrane is injected. Tympanic membrane is not perforated and not bulging. Decreased hearing is noted.  Left Ear: Tympanic membrane normal.  Mouth/Throat: Oropharynx is clear and moist. No oropharyngeal exudate.  Significant cerumen impaction and  also, found in the right ear  Eyes: Conjunctivae are normal. Pupils are equal, round, and reactive to light. Right eye exhibits no discharge. Left eye exhibits no discharge. No scleral icterus.  Neck: Normal range of motion. Neck supple. No thyromegaly present.  Cardiovascular: Normal rate, regular rhythm, normal heart sounds and intact distal pulses.  Exam reveals no gallop and no friction rub.   No murmur heard. Pulmonary/Chest: Effort normal and breath sounds normal. No stridor. No respiratory distress. He has no wheezes. He has no rales.  Abdominal: Soft. Bowel sounds are normal. He exhibits no distension. There is no tenderness. There is no rebound and no guarding.  Musculoskeletal: He exhibits no edema.  Lymphadenopathy:    He has no cervical adenopathy.  Neurological: He is alert. Coordination normal.  Skin: Skin is warm and dry. No rash noted. He is not diaphoretic. No pallor.  Psychiatric: He has a normal mood and affect.  Nursing note and vitals reviewed.    ED Treatments / Results  DIAGNOSTIC STUDIES:  Oxygen Saturation is 95% on RA, normal by my interpretation.    COORDINATION OF CARE:  1:22 PM Discussed treatment plan with pt at bedside and pt agreed to plan.   Labs (all labs ordered are listed, but only abnormal results are displayed) Labs Reviewed - No data to display  EKG  EKG Interpretation None       Radiology No results found.  Procedures .Ear Cerumen Removal Date/Time: 11/28/2015 5:32 PM Performed by: Emi Holes Authorized by: Emi Holes   Consent:    Consent obtained:  Verbal   Consent given by:  Patient Procedure details:    Location:  R ear   Procedure type: irrigation   Post-procedure details:    Inspection:  TM intact   Hearing quality:  Improved   Patient tolerance of procedure:  Tolerated well, no immediate complications Comments:     Patient felt much better after cerumen removal; hearing back to normal; however patient  still complaining of pain to inner ear   (including critical care time)  Medications Ordered in ED Medications  HYDROcodone-acetaminophen (NORCO/VICODIN) 5-325 MG per tablet 2 tablet (2 tablets Oral Given 11/28/15 1435)     Initial Impression / Assessment and Plan / ED Course  I have reviewed the triage vital signs and the nursing notes.  Pertinent labs & imaging results that were available during my care of the patient were reviewed by me and considered in my medical decision making (see chart for details).  Clinical Course    Patient presents with otalgia and exam consistent with acute otitis media. Patient felt much better after cerumen removal; hearing back to normal; however patient still complaining of pain to inner ear. No concern for acute mastoiditis, meningitis.  Patient discharged home with Augmentin, as TM still injected and patient with continued pain after treatment. Patient advised to discontinue amoxicillin. Supportive treatment discussed including over-the-counter  pain medications and ways to improve earwax in the future (water/hydrogen peroxide mixture, drops of olive oil, etc). Advised patient to follow-up with ENT if symptoms are not improving.  I have also discussed reasons to return immediately to the ER.  Patient expresses understanding and agrees with plan. Pt appears safe for discharge.   Final Clinical Impressions(s) / ED Diagnoses   Final diagnoses:  Impacted cerumen of right ear  Subacute otitis media, unspecified otitis media type  Foreign body of right ear, initial encounter    New Prescriptions Discharge Medication List as of 11/28/2015  3:43 PM    START taking these medications   Details  amoxicillin-clavulanate (AUGMENTIN) 875-125 MG tablet Take 1 tablet by mouth 2 (two) times daily., Starting Tue 11/28/2015, Print      I personally performed the services described in this documentation, which was scribed in my presence. The recorded information  has been reviewed and is accurate.    Emi Holes, PA-C 11/28/15 1735    Linwood Dibbles, MD 11/29/15 2135

## 2019-07-13 ENCOUNTER — Ambulatory Visit (HOSPITAL_COMMUNITY)
Admission: AD | Admit: 2019-07-13 | Discharge: 2019-07-13 | Disposition: A | Discharge status: Home / Self Care | Payer: Self-pay | Attending: Psychiatry | Admitting: Psychiatry

## 2019-07-13 DIAGNOSIS — Z1330 Encounter for screening examination for mental health and behavioral disorders, unspecified: Secondary | ICD-10-CM | POA: Insufficient documentation

## 2019-07-13 NOTE — BH Assessment (Signed)
Assessment Note  [name] is an 49 y.o. male with a history of Alcohol Use Disorder and Bipolar Disorder who presents under IVC with [employer].  Patient is quite guarded initially, as he is a bit confused as to why he is here reporting on his intake form that "someone took out papers because they care about me."  Patient states he has a history of chronic alcohol use for over 25 years.  He states he was recently sober for 41 days while in jail.  He was recently released and he relapsed on 5/23.  He states he only drank during the day on 5/23 and hasn't had anything since.  He was referred to a 90 day rehab program by [facility] and will enter the program on 6/3.  He denies other substance use.  He denies SI, HI and AVH.  He states he was diagnosed with Bipolar "many, many years ago."  He was admitted to [facility] at that time and recalls "maybe being on abilify." Patient has not engaged in outpatient treatment recently and he is not currently taking medications.    During the process of coordinating discharge, staff were under the impression patient had been cleared for discharge.  He had called a ride, was walked outside to smoke and when the RN went to have him come back in he had walked away and was not located.  Per [employer] officer [name] on site, officers alerted to be on the look out for pt.   Patient is a bit restless, cooperative and behaviorally appropriate.  Patient is casually dressed.  Speech is logical/coherent.  Eye contact is fair.  Patient's mood is euthymic.  Affect is congruent with mood.  Thought process is coherent and relevant.  There is no indication patient is responding to internal stimuli or experiencing delusional thought content.  Judgement and insight are fair.   Patient answered a call from this [facility] and stated he was provided with belongings and informed he was discharged.  He was back on the job site and had his employer, [name],  and employer's daughter, [name],  join call on speaker phone.  Both confirmed that patient has been doing well.  They also agree that there are no recent or current safety concerns.   Per Kevin Calkins, NP patient is psychiatrically cleared for discharge.  IVC to be rescinded.  Patient plans to enter a 90 day rehab program in [place] next week.      Diagnosis: Alcohol Use Disorder                    Bipolar Disorder   Past Medical History: No past medical history on file.  No past surgical history on file.  Family History: No family history on file.  Social History:  reports that he has been smoking cigarettes. He has never used smokeless tobacco. He reports current alcohol use. He reports that he does not use drugs.  Additional Social History:  Alcohol / Drug Use Pain Medications: None Prescriptions: None Over the Counter: None History of alcohol / drug use?: Yes Longest period of sobriety (when/how long): 41 days while in jail Negative Consequences of Use: Personal relationships, Work / School Substance #1 Name of Substance 1: ETOH 1 - Age of First Use: 23 1 - Amount (size/oz): 8 beers and 4 shots of patron 1 - Frequency: 1 day 1 - Duration: 1 day 1 - Last Use / Amount: 07/11/19  CIWA: CIWA-Ar BP: 138/84 Pulse Rate: 97  COWS:    Allergies: No Known Allergies  Home Medications: (Not in a hospital admission)   OB/GYN Status:  No LMP for male patient.  General Assessment Data Location of Assessment: [facility] Assessment Services TTS Assessment: In system Is this a Tele or Face-to-Face Assessment?: Face-to-Face Is this an Initial Assessment or a Re-assessment for this encounter?: Initial Assessment Patient Accompanied by:: Other([employer]) Language Other than English: No Living Arrangements: (hotel, paid for by employer) What gender do you identify as?: Male Marital status: Single Maiden name: N/A Pregnancy Status: No Living Arrangements: Alone Can pt return to current living arrangement?: Yes Admission Status:  Involuntary Petitioner: Other(son) Is patient capable of signing voluntary admission?: Yes(under IVC, considering rescinding) Referral Source: Self/Family/Friend Insurance type: Self pay     Crisis Care Plan Living Arrangements: Alone Legal Guardian: Other:(Self) Name of Psychiatrist: None Name of Therapist: None  Education Status Is patient currently in school?: No Is the patient employed, unemployed or receiving disability?: Employed  Risk to self with the past 6 months Suicidal Ideation: No Has patient been a risk to self within the past 6 months prior to admission? : No Suicidal Intent: No Has patient had any suicidal intent within the past 6 months prior to admission? : No Is patient at risk for suicide?: No Suicidal Plan?: No Has patient had any suicidal plan within the past 6 months prior to admission? : No Access to Means: No What has been your use of drugs/alcohol within the last 12 months?: recent alcohol use Previous Attempts/Gestures: No How many times?: 0 Other Self Harm Risks: None Triggers for Past Attempts: (N/A) Intentional Self Injurious Behavior: None Family Suicide History: Unknown Recent stressful life event(s): Other (Comment)(relapse 07/11/19 ) Persecutory voices/beliefs?: No Depression: No Substance abuse history and/or treatment for substance abuse?: Yes Suicide prevention information given to non-admitted patients: Not applicable  Risk to Others within the past 6 months Homicidal Ideation: No Does patient have any lifetime risk of violence toward others beyond the six months prior to admission? : No Thoughts of Harm to Others: No Current Homicidal Intent: No Current Homicidal Plan: No Access to Homicidal Means: No Identified Victim: N/A History of harm to others?: No Assessment of Violence: None Noted Violent Behavior Description: N/A Does patient have access to weapons?: No Criminal Charges Pending?: No Does patient have a court date:  No Is patient on probation?: No  Psychosis Hallucinations: None noted Delusions: None noted  Mental Status Report Appearance/Hygiene: Unremarkable Eye Contact: Good Motor Activity: Restlessness(mild restlessness) Speech: Logical/coherent Level of Consciousness: Alert Mood: Irritable(mildly irritable) Affect: Appropriate to circumstance Anxiety Level: Minimal Thought Processes: Coherent, Relevant Orientation: Person, Place, Time, Situation Obsessive Compulsive Thoughts/Behaviors: None  Cognitive Functioning Concentration: Normal Memory: Recent Intact, Remote Intact Is patient IDD: No Insight: Fair Impulse Control: Fair Appetite: Fair Have you had any weight changes? : No Change Sleep: Decreased Total Hours of Sleep: 6 Vegetative Symptoms: None  ADLScreening [facility] Assessment Services) Patient's cognitive ability adequate to safely complete daily activities?: Yes Patient able to express need for assistance with ADLs?: Yes Independently performs ADLs?: Yes (appropriate for developmental age)  Prior Inpatient Therapy Prior Inpatient Therapy: Yes Prior Therapy Dates: "20 yrs ago" - 3 admissions Prior Therapy Facilty/Provider(s): [facility] Reason for Treatment: Bipolar  Prior Outpatient Therapy Prior Outpatient Therapy: No Does patient have an ACCT team?: No Does patient have Intensive In-House Services?  : No Does patient have [facility] services? : No Does patient have P4CC services?: No  ADL Screening (condition at  time of admission) Patient's cognitive ability adequate to safely complete daily activities?: Yes Is the patient deaf or have difficulty hearing?: No Does the patient have difficulty seeing, even when wearing glasses/contacts?: No Does the patient have difficulty concentrating, remembering, or making decisions?: No Patient able to express need for assistance with ADLs?: Yes Does the patient have difficulty dressing or bathing?: No Independently performs  ADLs?: Yes (appropriate for developmental age) Does the patient have difficulty walking or climbing stairs?: No Weakness of Legs: None Weakness of Arms/Hands: None  Home Assistive Devices/Equipment Home Assistive Devices/Equipment: None  Therapy Consults (therapy consults require a physician order) PT Evaluation Needed: No OT Evalulation Needed: No SLP Evaluation Needed: No Abuse/Neglect Assessment (Assessment to be complete while patient is alone) Abuse/Neglect Assessment Can Be Completed: Yes Physical Abuse: Denies Verbal Abuse: Denies Sexual Abuse: Denies Exploitation of patient/patient's resources: Denies Self-Neglect: Denies Values / Beliefs Cultural Requests During Hospitalization: None Spiritual Requests During Hospitalization: None Consults Spiritual Care Consult Needed: No Transition of Care Team Consult Needed: No Advance Directives (For Healthcare) Does Patient Have a Medical Advance Directive?: No Would patient like information on creating a medical advance directive?: No - Patient declined     Disposition: Per Marvia Pickles, NP patient is psychiatrically cleared for discharge.  IVC to be rescinded.  Patient plans to enter a 90 day rehab program in [place] next week.   Disposition Initial Assessment Completed for this Encounter: Yes Disposition of Patient: (TBD) Patient refused recommended treatment: No  On Site Evaluation by:   Reviewed with Physician:    Fransico Meadow 07/13/2019 4:21 PM

## 2019-07-13 NOTE — H&P (Signed)
Behavioral Health Medical Screening Exam  [name] is an 49 y.o. male.  Total Time spent with patient: 30 minutes  Psychiatric Specialty Exam: Physical Exam  Nursing note and vitals reviewed. Constitutional: He is oriented to person, place, and time. He appears well-developed and well-nourished.  Cardiovascular: Normal rate.  Respiratory: Effort normal.  Musculoskeletal:        General: Normal range of motion.  Neurological: He is alert and oriented to person, place, and time.  Skin: Skin is warm.    Review of Systems  Constitutional: Negative.   HENT: Negative.   Eyes: Negative.   Respiratory: Negative.   Cardiovascular: Negative.   Gastrointestinal: Negative.   Genitourinary: Negative.   Musculoskeletal: Negative.   Skin: Negative.   Neurological: Negative.   Psychiatric/Behavioral: Negative.     Blood pressure 138/84, pulse 97, temperature 98.5 F (36.9 C), temperature source Oral, resp. rate 18, SpO2 97 %.There is no height or weight on file to calculate BMI.  General Appearance: Casual  Eye Contact:  Good  Speech:  Clear and Coherent and Normal Rate  Volume:  Normal  Mood:  Euthymic  Affect:  Congruent  Thought Process:  Coherent and Descriptions of Associations: Intact  Orientation:  Full (Time, Place, and Person)  Thought Content:  WDL  Suicidal Thoughts:  No  Homicidal Thoughts:  No  Memory:  Immediate;   Good Recent;   Good Remote;   Good  Judgement:  Fair  Insight:  Fair  Psychomotor Activity:  Normal  Concentration: Concentration: Good  Recall:  Good  Fund of Knowledge:Good  Language: Good  Akathisia:  No  Handed:  Right  AIMS (if indicated):     Assets:  Architect Housing Physical Health Social Support  Sleep:       Musculoskeletal: Strength & Muscle Tone: within normal limits Gait & Station: normal Patient leans: N/A  Blood pressure 138/84, pulse 97, temperature 98.5 F (36.9 C), temperature  source Oral, resp. rate 18, SpO2 97 %.  Recommendations:  Based on my evaluation the patient does not appear to have an emergency medical condition.  Gerlene Burdock Joella Saefong, FNP 07/13/2019, 5:01 PM

## 2022-10-17 ENCOUNTER — Ambulatory Visit (HOSPITAL_COMMUNITY)
Admission: EM | Admit: 2022-10-17 | Discharge: 2022-10-17 | Disposition: A | Discharge status: Home / Self Care | Payer: No Payment, Other | Attending: Behavioral Health | Admitting: Behavioral Health

## 2022-10-17 DIAGNOSIS — F319 Bipolar disorder, unspecified: Secondary | ICD-10-CM | POA: Insufficient documentation

## 2022-10-17 DIAGNOSIS — F4322 Adjustment disorder with anxiety: Secondary | ICD-10-CM | POA: Insufficient documentation

## 2022-10-17 DIAGNOSIS — Z560 Unemployment, unspecified: Secondary | ICD-10-CM | POA: Insufficient documentation

## 2022-10-17 NOTE — Discharge Instructions (Addendum)
Discharge recommendations:   Outpatient Follow up: Please review list of outpatient resources for psychiatry and counseling. Please follow up with your primary care provider for all medical related needs.   You are encouraged to follow up with [facility] for outpatient treatment.  Walk in/ Open Access Hours: Monday - Friday 8AM - 11AM (To see provider and therapist) Friday - 1PM - 4PM (To see therapist only)  [facility] [address] [place], [place] [phone]  Therapy: We recommend that patient participate in individual therapy to address mental health concerns.  Safety:   The following safety precautions should be taken:   No sharp objects. This includes scissors, razors, scrapers, and putty knives.   Chemicals should be removed and locked up.   Medications should be removed and locked up.   Weapons should be removed and locked up. This includes firearms, knives and instruments that can be used to cause injury.   The patient should abstain from use of illicit substances/drugs and abuse of any medications.  If symptoms worsen or do not continue to improve or if the patient becomes actively suicidal or homicidal then it is recommended that the patient return to the closest hospital emergency department, the [facility], or call 911 for further evaluation and treatment. National Suicide Prevention Lifeline 1-800-SUICIDE or 1-800-273-8255.  About 988 988 offers 24/7 access to trained crisis counselors who can help people experiencing mental health-related distress. People can call or text 988 or chat 988lifeline.org for themselves or if they are worried about a loved one who may need crisis support.     

## 2022-10-17 NOTE — Progress Notes (Signed)
   10/17/22 1635  [facility] Triage Screening (Walk-ins at [facility] only)  How Did You Hear About [place]? Family/Friend  What Is the Reason for Your Visit/Call Today? Pt arrived to [facility] voluntarily accompanied by wife & sister-in-law. Pt states that he is struggling with sleeping. Pt denies SI, HI, and AVH at this current time. Pt states that he drunk 1 bootlegger around noon today. Pt states that he does have a little problem with drinking alcohol. Pt states that he hasn't been drunk in over a week. Pt also stated that he will drink a bootlegger or an airplane bottle every now and then.  How Long Has This Been Causing You Problems? > than 6 months  Have You Recently Had Any Thoughts About Hurting Yourself? No  Are You Planning to Commit Suicide/Harm Yourself At This time? No  Have you Recently Had Thoughts About Hurting Someone Karolee Ohs? No  Are You Planning To Harm Someone At This Time? No  Are you currently experiencing any auditory, visual or other hallucinations? No  Have You Used Any Alcohol or Drugs in the Past 24 Hours? Yes  How long ago did you use Drugs or Alcohol? today  What Did You Use and How Much? one bootlegger  Do you have any current medical co-morbidities that require immediate attention? No  Clinician description of patient physical appearance/behavior: cooperative, good eye contact  What Do You Feel Would Help You the Most Today? Medication(s);Social Support  If access to [facility] was not available, would you have sought care in the Emergency Department? No  Determination of Need Routine (7 days)  Options For Referral Medication Management;Chemical Dependency Intensive Outpatient Therapy (CDIOP);Outpatient Therapy

## 2022-10-17 NOTE — ED Provider Notes (Signed)
Behavioral Health Urgent Care Medical Screening Exam  Patient Name: [name] MRN: [id number] Date of Evaluation: 10/17/22 Diagnosis:  Final diagnoses:  Adjustment disorder with anxious mood    History of Present illness: [name] is a 52 y.o. male patient with a reported psychiatric history significant for bipolar who presents to the [facility] behavioral health urgent care voluntarily accompanied by his wife and sister-in-law with a chief complaint of "struggling with sleeping."   Patient seen and evaluated face-to-face by this provider, and chart reviewed. On evaluation, patient is alert and oriented x 4. His thought process is linear and speech is clear and coherent. His mood is euthymic and affect is congruent. He denies SI/HI/AVH. There is no objective evidence that the patient is currently responding to internal or external stimuli. He is calm and cooperative and does not appear to be in acute distress.  Patient states that he slept like a baby last night and that he needs help with his nerves. He states that he needs something that is a nonnarcotic because he does not want to get hooked on drugs. He states that in the past he was prescribed lithium and Depakote for bipolar but has been out of medications for the past 20 to 30 years. He denies outpatient psychiatry or therapy. 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 He states that his anxiety is good for now but when he gets nervous he needs something to take from time to time. He is unable to further describe his anxiety symptoms. He denies recent manic or depressive episodes. He denies impulsive or risky behaviors. He reports fair sleep, last night he slept 10 hours. He reports a good appetite. He appears to minimize his substance use and is vague in providing detailed information pertaining to his  substance use. He states that he stopped smoking marijuana 10 years ago and started back smoking marijuana 6 months ago, on average 1-2 times per week. He states that he started back drinking alcohol 12 days ago but is unable to recall how long he's been without drinking alcohol prior to starting back 12 days ago. He reports drinking alcohol since late 20s. He denies history of alcohol withdrawal seizures or DTs. He states that he attends AA meeting weekly. He resides with his wife. He has two adult sons. He is unemployed and states that he starts a new job next week. He reports a family psychiatric history of sister history of anxiety and brother history of anxiety.   Plan of care: I discussed with the patient following up here at the [facility] behavioral [facility] outpatient clinic for an open access during walk-in hours for psychiatry and therapy to address anxiety. Patient verbalizes understanding and is agreeable to come tomorrow morning on 10/18/2022 for an evaluation. As I was going over the patient's discharge instructions with the patient, a male walked up to me and states that the patient needs to stay and be put back on his medications because today he went ballistic at home. She states that he is a danger to himself. I discussed with the individual that she would need to go down to the magistrate office to file for an involuntary commitment if patient poses a threat to himself or others. Safety planning completed at the time of discharge.  Flowsheet Row ED from 10/17/2022 in [place]  [facility]  C-SSRS RISK CATEGORY No Risk       Psychiatric Specialty Exam  Presentation  General Appearance:Appropriate for Environment  Eye Contact:Fair  Speech:Clear and Coherent  Speech Volume:Normal  Handedness:Right   Mood and Affect  Mood: Euthymic  Affect: Congruent   Thought Process  Thought Processes: Coherent  Descriptions of  Associations:Intact  Orientation:Full (Time, Place and Person)  Thought Content:Logical    Hallucinations:None  Ideas of Reference:None  Suicidal Thoughts:No  Homicidal Thoughts:No   Sensorium  Memory: Immediate Fair; Recent Fair; Remote Fair  Judgment: Fair  Insight: Fair   Art therapist  Concentration: Fair  Attention Span: Fair  Recall: [employer] of Knowledge: Fair  Language: Fair   Psychomotor Activity  Psychomotor Activity: Normal   Assets  Assets: Manufacturing systems engineer; Desire for Improvement; Leisure Time; Physical Health   Sleep  Sleep: Fair  Number of hours:  10   Physical Exam: Physical Exam HENT:     Nose: Nose normal.  Eyes:     Conjunctiva/sclera: Conjunctivae normal.  Cardiovascular:     Rate and Rhythm: Normal rate.  Pulmonary:     Effort: Pulmonary effort is normal.  Musculoskeletal:        General: Normal range of motion.     Cervical back: Normal range of motion.  Neurological:     Mental Status: He is alert and oriented to person, place, and time.    Review of Systems  Constitutional: Negative.   HENT: Negative.    Eyes: Negative.   Respiratory: Negative.    Cardiovascular: Negative.   Gastrointestinal: Negative.   Genitourinary: Negative.   Musculoskeletal: Negative.   Neurological: Negative.   Endo/Heme/Allergies: Negative.    Blood pressure (!) 138/92, pulse 93, temperature 98.6 F (37 C), temperature source Oral, resp. rate 20, SpO2 97%. There is no height or weight on file to calculate BMI.  Musculoskeletal: Strength & Muscle Tone: within normal limits Gait & Station: normal Patient leans: N/A   [facility] MSE Discharge Disposition for Follow up and Recommendations: Based on my evaluation the patient does not appear to have an emergency medical condition and can be discharged with resources and follow up care in outpatient services for Medication Management, Individual Therapy, and Group  Therapy  Discharge recommendations:   Outpatient Follow up: Please review list of outpatient resources for psychiatry and counseling. Please follow up with your primary care provider for all medical related needs.   You are encouraged to follow up with [facility] for outpatient treatment.  Walk in/ Open Access Hours: Monday - Friday 8AM - 11AM (To see provider and therapist) Friday - 1PM - 4PM (To see therapist only)  [facility] [address] [place], [place] [phone]  Therapy: We recommend that patient participate in individual therapy to address mental health concerns.  Safety:   The following safety precautions should be taken:   No sharp objects. This includes scissors, razors, scrapers, and putty knives.   Chemicals should be removed and locked up.   Medications should be removed and locked up.   Weapons should be removed and locked up. This includes firearms, knives and instruments that can be used to cause injury.   The patient should abstain from use of illicit substances/drugs and abuse of any medications.  If symptoms worsen or do not continue to improve or if the patient becomes actively suicidal or homicidal then it is recommended that the patient return to the closest hospital emergency  department, the [facility], or call 911 for further evaluation and treatment. National Suicide Prevention Lifeline 1-800-SUICIDE or 202-549-7490.  About 988 988 offers 24/7 access to trained crisis counselors who can help people experiencing mental health-related distress. People can call or text 988 or chat 988lifeline.org for themselves or if they are worried about a loved one who may need crisis support.       Layla Barter, NP 10/17/2022, 5:19 PM

## 2022-10-18 ENCOUNTER — Ambulatory Visit (INDEPENDENT_AMBULATORY_CARE_PROVIDER_SITE_OTHER): Payer: No Payment, Other | Admitting: Physician Assistant

## 2022-10-18 VITALS — BP 157/94 | HR 83 | Temp 98.4°F | Ht 72.0 in | Wt 198.6 lb

## 2022-10-18 DIAGNOSIS — G479 Sleep disorder, unspecified: Secondary | ICD-10-CM

## 2022-10-18 DIAGNOSIS — F4322 Adjustment disorder with anxiety: Secondary | ICD-10-CM

## 2022-10-18 DIAGNOSIS — R0781 Pleurodynia: Secondary | ICD-10-CM | POA: Diagnosis not present

## 2022-10-18 DIAGNOSIS — Z758 Other problems related to medical facilities and other health care: Secondary | ICD-10-CM

## 2022-10-18 MED ORDER — HYDROXYZINE HCL 10 MG PO TABS: 10.0000 mg | ORAL_TABLET | Freq: Three times a day (TID) | ORAL | 1 refills | Status: DC | PRN

## 2022-10-18 MED ORDER — RAMELTEON 8 MG PO TABS: 8.0000 mg | ORAL_TABLET | Freq: Every evening | ORAL | 1 refills | Status: AC | PRN

## 2022-10-18 MED ORDER — CYCLOBENZAPRINE HCL 10 MG PO TABS: 10.0000 mg | ORAL_TABLET | Freq: Two times a day (BID) | ORAL | 0 refills | Status: AC | PRN

## 2022-10-18 NOTE — Progress Notes (Unsigned)
Psychiatric Initial Adult Assessment   Patient Identification: [name] MRN:  [id number] Date of Evaluation:  10/21/2022 Referral Source: Referred by [facility] Chief Complaint:   Chief Complaint  Patient presents with   Follow-up   Medication Management   Visit Diagnosis:    ICD-10-CM   1. Adjustment disorder with anxious mood  F43.22 hydrOXYzine (ATARAX) 10 MG tablet    2. Rib pain  R07.81 cyclobenzaprine (FLEXERIL) 10 MG tablet    3. Sleep disturbances  G47.9 ramelteon (ROZEREM) 8 MG tablet    4. Does not have primary care provider  Z75.8 Ambulatory Referral to Primary Care      History of Present Illness:    [name]. [name] is a 52 year old male with no documented past psychiatric illness who presents to [facility] to establish psychiatric care and for medication management.  Patient presents to the encounter requesting medication management.  Patient reports that he has been out of work since the eighth of this month and has no Medicaid.  Patient also reports that he does not have a primary care provider at this time.  Patient reports that he presented as a walk-in today to receive medication.  He reports that he needs medications prescribed to him on the list he provided.  Prior to this assessment, patient reports that he has been extremely stressed and has disrupted his ability to sleep on occasion.  Although he endorses disrupted sleep, he reports that there are some days where he is able to get sleep.  Patient denies being on any medications when he was able to sleep.  Patient does admit to smoking marijuana on occasion to help alleviate his stress.  Patient endorses anxiety and rates his anxiety as 2 out of 10.  Patient's main stressor revolves around the recent death of a lady friend due to Fentanyl overdose.  Prior to her passing, patient reports that he had refused for her to live with him when she  asked him.  Patient denies depressive symptoms at this time.  Patient endorses a past history of hospitalization due to mental health stating that he was last hospitalized 30 years ago.  Patient denies a past history of suicide attempt.  A GAD-7 screen was performed with the patient scoring a 4.  Patient is alert and oriented x 4, calm, cooperative, and fully engaged in conversation during the encounter.  Patient endorses happy mood.  Patient denies suicidal or homicidal ideations.  He further denied auditory or visual hallucinations and does not appear to be responding to internal/external stimuli.  Patient denies paranoia or delusional thoughts.  Patient endorses improved sleep.  Patient endorses good appetite and eats on average 4 meals per day.  Patient endorses alcohol consumption on occasion.  Patient endorses tobacco use and smokes on average a pack per day.  Patient endorses some illicit drug use in the form of marijuana.  Associated Signs/Symptoms: Depression Symptoms:  anxiety, (Hypo) Manic Symptoms:  Licensed conveyancer, Grandiosity, Impulsivity, Labiality of Mood, Anxiety Symptoms:   Patient denies Psychotic Symptoms:   Patient denies PTSD Symptoms: Had a traumatic exposure:  Patient endorses trauma 30 years ago but did not go into detail of what that trauma Had a traumatic exposure in the last month:  N/A Re-experiencing:  Nightmares Hypervigilance:  No Hyperarousal:  Irritability/Anger Sleep Avoidance:  None  Past Psychiatric History:  Patient denies any official psychiatric diagnoses  Patient endorses a past history of hospitalization due to  mental health stating that they were last hospitalized 30 years ago. Patient did not go into further detail of why he was previously hospitalized.  Patient denies a past history of suicide attempt  Patient denies a past history of homicide attempt  Previous Psychotropic Medications: No   Substance Abuse History in the last 12  months:  No.  Consequences of Substance Abuse: Patient endorses a past history of alcohol abuse Medical Consequences:  patient endorses hospitalization 30 years ago Legal Consequences:  patient endorses legal consequences 30 years ago Family Consequences:  patient endorses family consequences 30 years ago Blackouts:  patient endorses black outs 30 years ago DT's: patient endorses tremors Withdrawal Symptoms:   patient endorses withdrawal symptoms roughly 7 days ago  Past Medical History: History reviewed. No pertinent past medical history. History reviewed. No pertinent surgical history.  Family Psychiatric History:  Patient states "my whole family is nuts." He reports that his sister has been on Valium and Xanax.  Family history of suicide attempts: Patient denies Family history of homicide attempts: Patient denies Family history of substance abuse: Patient reports that his father abused alcohol  Family History: History reviewed. No pertinent family history.  Social History:   Social History   Socioeconomic History   Marital status: Single    Spouse name: Not on file   Number of children: Not on file   Years of education: Not on file   Highest education level: Not on file  Occupational History   Not on file  Tobacco Use   Smoking status: Every Day    Types: Cigarettes   Smokeless tobacco: Never  Substance and Sexual Activity   Alcohol use: Yes    Comment: heavy   Drug use: No   Sexual activity: Not on file  Other Topics Concern   Not on file  Social History Narrative   Not on file   Social Determinants of Health   Financial Resource Strain: Not on file  Food Insecurity: Not on file  Transportation Needs: Not on file  Physical Activity: Not on file  Stress: Not on file  Social Connections: Not on file    Additional Social History:  Patient endorses social support. Patient endorses having children of his own. Patient endorses housing. Patient is currently  unemployed. Patient denies Research officer, trade union. Patient denies prison or jail time. Highest grade completed by the patient is 10th grade. Patient denies access to weapons.  Allergies:  No Known Allergies  Metabolic Disorder Labs: No results found for: "HGBA1C", "MPG" No results found for: "PROLACTIN" No results found for: "CHOL", "TRIG", "HDL", "CHOLHDL", "VLDL", "LDLCALC" No results found for: "TSH"  Therapeutic Level Labs: No results found for: "LITHIUM" No results found for: "CBMZ" No results found for: "VALPROATE"  Current Medications: Current Outpatient Medications  Medication Sig Dispense Refill   hydrOXYzine (ATARAX) 10 MG tablet Take 1 tablet (10 mg total) by mouth 3 (three) times daily as needed. 75 tablet 1   amoxicillin-clavulanate (AUGMENTIN) 875-125 MG tablet Take 1 tablet by mouth 2 (two) times daily. 14 tablet 0   cyclobenzaprine (FLEXERIL) 10 MG tablet Take 1 tablet (10 mg total) by mouth 2 (two) times daily as needed for muscle spasms. 60 tablet 0   predniSONE (DELTASONE) 10 MG tablet Take 2 tablets (20 mg total) by mouth daily. 16 tablet 0   ramelteon (ROZEREM) 8 MG tablet Take 1 tablet (8 mg total) by mouth at bedtime as needed for sleep. 30 tablet 1   sildenafil (VIAGRA) 100  MG tablet Take 100 mg by mouth daily as needed for erectile dysfunction.     sulfamethoxazole-trimethoprim (SEPTRA DS) 800-160 MG per tablet Take 1 tablet by mouth every 12 (twelve) hours. 10 tablet 0   No current facility-administered medications for this visit.    Musculoskeletal: Strength & Muscle Tone: within normal limits Gait & Station: normal Patient leans: N/A  Psychiatric Specialty Exam: Review of Systems  Psychiatric/Behavioral:  Positive for sleep disturbance. Negative for decreased concentration, dysphoric mood, hallucinations, self-injury and suicidal ideas. The patient is nervous/anxious. The patient is not hyperactive.     Blood pressure (!) 157/94, pulse 83, temperature  98.4 F (36.9 C), height 6' (1.829 m), weight 198 lb 9.6 oz (90.1 kg), SpO2 98%.Body mass index is 26.94 kg/m.  General Appearance: Casual  Eye Contact:  Good  Speech:  Clear and Coherent and Normal Rate  Volume:  Normal  Mood:  Anxious  Affect:  Appropriate  Thought Process:  Coherent, Goal Directed, and Descriptions of Associations: Intact  Orientation:  Full (Time, Place, and Person)  Thought Content:  WDL  Suicidal Thoughts:  No  Homicidal Thoughts:  No  Memory:  Immediate;   Good Recent;   Fair Remote;   Fair  Judgement:  Fair  Insight:  Fair  Psychomotor Activity:  Normal  Concentration:  Concentration: Good and Attention Span: Good  Recall:  Good  Fund of Knowledge:Fair  Language: Good  Akathisia:  No  Handed:  Right  AIMS (if indicated):  not done  Assets:  Communication Skills Desire for Improvement Housing Social Support Vocational/Educational  ADL's:  Intact  Cognition: WNL  Sleep:  Fair   Screenings: GAD-7    Garment/textile technologist Visit from 10/18/2022 in [facility]  Total GAD-7 Score 4      PHQ2-9    Flowsheet Row Office Visit from 10/18/2022 in [facility]  PHQ-2 Total Score 0      Flowsheet Row Office Visit from 10/18/2022 in [facility] ED from 10/17/2022 in [facility]  C-SSRS RISK CATEGORY No Risk No Risk       Assessment and Plan:   [name] is a 52 year old male with no documented past psychiatric illness who presents to [facility] to establish psychiatric care and for medication management.  Patient presents to the encounter requesting for his medications to be refilled.  Patient was recently assessed at [facility] and informed the provider that he was told that he could get his medications refilled.  Patient is requesting the following medications  to be refilled:  Ramelteon 8 mg at bedtime as needed Prednisone 20 mg daily Sildenafil 100 mg daily as needed Cyclobenzaprine 10 mg 2 times daily as needed Sulfamethoxazole-trimethoprim 800-160 mg 1 tablet every 12 hours Amoxicillin-clavulanate 875-125 mg 1 tablet 2 times daily  Provider informed patient that prescriptions for prednisone, sildenafil, sulfamethoxazole-trimethoprim, and amoxicillin-clavulanate could not be filled by this facility.  Provider recommended patient being set up with a primary care provider and orders for him to receive refills of the medication requested by him.  Patient was informed that he could receive a 30-day supply of cyclobenzaprine 10 mg 2 times daily as needed for the management of his pain.  Patient verbalized understanding.  Provider also recommended patient be placed on hydroxyzine 10 mg 3 times daily as needed for the management of his anxiety.  Patient was agreeable  to recommendation.  Patient to continue taking ramelteon 8 mg at bedtime as needed for the management of his sleep.  Patient's medications to be e-prescribed to pharmacy of choice.  Provider to refer patient to a primary care provider following the conclusion of the encounter.  Collaboration of Care: Medication Management AEB provider managing patient's psychiatric medication and Psychiatrist AEB patient being followed by a mental health provider  Patient/Guardian was advised Release of Information must be obtained prior to any record release in order to collaborate their care with an outside provider. Patient/Guardian was advised if they have not already done so to contact the registration department to sign all necessary forms in order for [place] to release information regarding their care.   Consent: Patient/Guardian gives verbal consent for treatment and assignment of benefits for services provided during this visit. Patient/Guardian expressed understanding and agreed to proceed.   1. Adjustment  disorder with anxious mood  - hydrOXYzine (ATARAX) 10 MG tablet; Take 1 tablet (10 mg total) by mouth 3 (three) times daily as needed.  Dispense: 75 tablet; Refill: 1  2. Rib pain  - cyclobenzaprine (FLEXERIL) 10 MG tablet; Take 1 tablet (10 mg total) by mouth 2 (two) times daily as needed for muscle spasms.  Dispense: 60 tablet; Refill: 0  3. Sleep disturbances  - ramelteon (ROZEREM) 8 MG tablet; Take 1 tablet (8 mg total) by mouth at bedtime as needed for sleep.  Dispense: 30 tablet; Refill: 1  4. Does not have primary care provider  - Ambulatory Referral to Primary Care  Patient to follow up in 6 weeks Provider spent a total of 43 minutes with the patient/reviewing the patient's chart  Meta Hatchet, PA 9/2/20248:47 AM

## 2022-10-21 ENCOUNTER — Encounter (HOSPITAL_COMMUNITY): Payer: Self-pay | Admitting: Physician Assistant

## 2022-11-21 ENCOUNTER — Emergency Department (HOSPITAL_COMMUNITY): Payer: No Typology Code available for payment source

## 2022-11-21 ENCOUNTER — Encounter (HOSPITAL_COMMUNITY): Payer: Self-pay | Admitting: Emergency Medicine

## 2022-11-21 ENCOUNTER — Other Ambulatory Visit: Payer: Self-pay

## 2022-11-21 ENCOUNTER — Emergency Department (HOSPITAL_COMMUNITY)
Admission: EM | Admit: 2022-11-21 | Discharge: 2022-11-21 | Disposition: A | Discharge status: Home / Self Care | Payer: No Typology Code available for payment source | Attending: Emergency Medicine | Admitting: Emergency Medicine

## 2022-11-21 DIAGNOSIS — S40812A Abrasion of left upper arm, initial encounter: Secondary | ICD-10-CM | POA: Insufficient documentation

## 2022-11-21 DIAGNOSIS — W228XXA Striking against or struck by other objects, initial encounter: Secondary | ICD-10-CM | POA: Insufficient documentation

## 2022-11-21 DIAGNOSIS — S20212A Contusion of left front wall of thorax, initial encounter: Secondary | ICD-10-CM | POA: Insufficient documentation

## 2022-11-21 DIAGNOSIS — Z23 Encounter for immunization: Secondary | ICD-10-CM | POA: Insufficient documentation

## 2022-11-21 DIAGNOSIS — R079 Chest pain, unspecified: Secondary | ICD-10-CM | POA: Diagnosis present

## 2022-11-21 MED ORDER — TETANUS-DIPHTH-ACELL PERTUSSIS 5-2.5-18.5 LF-MCG/0.5 IM SUSY
0.5000 mL | PREFILLED_SYRINGE | Freq: Once | INTRAMUSCULAR | Status: AC
  Administered 2022-11-21: 0.5 mL via INTRAMUSCULAR
  Filled 2022-11-21: qty 0.5

## 2022-11-21 MED ORDER — OXYCODONE-ACETAMINOPHEN 5-325 MG PO TABS
2.0000 | ORAL_TABLET | Freq: Once | ORAL | Status: AC
  Administered 2022-11-21: 2 via ORAL
  Filled 2022-11-21: qty 2

## 2022-11-21 MED ORDER — OXYCODONE-ACETAMINOPHEN 5-325 MG PO TABS: 1.0000 | ORAL_TABLET | Freq: Four times a day (QID) | ORAL | 0 refills | Status: AC | PRN

## 2022-11-21 NOTE — Discharge Instructions (Signed)
You are seen here in the emergency department for treat to your chest wall. X-rays were obtained and do not show any obvious rib fractures or pneumothorax Please use pain medicine as needed for pain control.  You are being given a prescription for Percocet.  You should then switch over to acetaminophen and ibuprofen. Please use the incentive spirometer frequently to make sure you are taking good deep breaths. Return to the emergency department if you are having more shortness of breath or other new injuries.

## 2022-11-21 NOTE — ED Notes (Signed)
EDP at bedside for MSE.  
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
## 2022-11-21 NOTE — ED Notes (Signed)
Pt in NAD at d/c from ED. A&O. Ambulatory. Respirations even & unlabored. Skin warm & dry. Pt verbalized understanding of d/c teaching including follow up care, medications, pain management and reasons to return to the ED. No needs or questions expressed at d/c.

## 2022-11-21 NOTE — ED Notes (Signed)
Pt provided with incentive spirometer. Pt educated on use and demonstrated understanding by correctly using device.

## 2022-11-30 NOTE — Progress Notes (Signed)
BH MD Outpatient Progress Note  12/06/2022 8:46 AM [name]  MRN:  [id number]  Assessment:  [name] presents for follow-up evaluation in-person. Today, 12/06/22, patient reports tremendous improvement with hydroxyzine with no side effects. He declined starting a scheduled SSRI given present efficacy of hydroxyzine. Plan to continue current regiment and follow up in 2 months for continued monitoring.   Identifying Information: [name] is a 52 y.o. male with history of adjustment disorder with anxious mood who is an established patient with [facility] for medication management.   Plan:  # Adjustment disorder with anxious mood Past medication trials:  Status of problem: active Interventions: -- continue hydroxyzine 10 mg bid for anxiety -- continue ramelteon 2 mg at bedtime prn   Return to care in 2 months  Patient was given contact information for behavioral health clinic and was instructed to call 911 for emergencies.    Patient and plan of care will be discussed with the Attending MD, Dr. Lucianne Muss, who agrees with the above statement and plan.   Subjective:  Chief Complaint: Medication Management   Interval History: Patient presents for follow-up.  He was last seen by Otila Back 10/18/22 and was started on hydroxyzine as needed for anxiety.  He denies SI/HI/AVH.  Reports that his mood has significantly improved.  He feels like he is back to his normal self and is able to manage significantly stressful situations with appropriate and calm demeanor/behavior.  He reports wanting to continue on this medication for as long as possible given the significant benefits from it.  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 He reports sleep and appetite have been appropriate. He felt the full dose of ramelteon was way too  overwhelming so he is taking 0.25 of what is needed. He would like to follow-up with me for psychiatric needs.  He also reports some dental pain as well as rib pain related to possible dental abscess and rib contusion respectively. He reports decreasing his alcohol use from multiple times daily to 1 vodka cranberry a night.   Visit Diagnosis:    ICD-10-CM   1. Adjustment disorder with anxious mood  F43.22 hydrOXYzine (ATARAX) 10 MG tablet    2. Rib pain  R07.81     3. Does not have primary care provider  Z75.8 Ambulatory referral to Internal Medicine      Past Psychiatric History:  Diagnoses: Adjustment Disorder Medication trials: hydroxyzine Previous psychiatrist/therapist: none Hospitalizations: denies Suicide attempts: denies SIB: denies Hx of violence towards others: denies Current access to guns: denies Hx of trauma/abuse: denies Substance use: alcohol  Past Medical History:  No past medical history on file.  No past surgical history on file.  Family Psychiatric History: denies  Family History:  No family history on file.  Social History:  Academic/Vocational: electrician Social History   Socioeconomic History   Marital status: Single    Spouse name: Not on file   Number of children: Not on file   Years of education: Not on file   Highest education level: Not on file  Occupational History   Not on file  Tobacco Use   Smoking status: Every Day    Current packs/day: 0.50    Types: Cigarettes   Smokeless tobacco: Never  Substance and Sexual Activity   Alcohol use:  Yes    Comment: heavy   Drug use: No   Sexual activity: Not on file  Other Topics Concern   Not on file  Social History Narrative   Not on file   Social Determinants of Health   Financial Resource Strain: Not on file  Food Insecurity: Not on file  Transportation Needs: Not on file  Physical Activity: Not on file  Stress: Not on file  Social Connections: Not on file    Allergies:  No  Known Allergies  Current Medications: Current Outpatient Medications  Medication Sig Dispense Refill   cyclobenzaprine (FLEXERIL) 10 MG tablet Take 1 tablet (10 mg total) by mouth 2 (two) times daily as needed for muscle spasms. 60 tablet 0   hydrOXYzine (ATARAX) 10 MG tablet Take 1 tablet (10 mg total) by mouth 2 (two) times daily. 60 tablet 1   oxyCODONE-acetaminophen (PERCOCET/ROXICET) 5-325 MG tablet Take 1 tablet by mouth every 6 (six) hours as needed for severe pain. 10 tablet 0   predniSONE (DELTASONE) 10 MG tablet Take 2 tablets (20 mg total) by mouth daily. 16 tablet 0   ramelteon (ROZEREM) 8 MG tablet Take 1 tablet (8 mg total) by mouth at bedtime as needed for sleep. 30 tablet 1   sildenafil (VIAGRA) 100 MG tablet Take 100 mg by mouth daily as needed for erectile dysfunction.     sulfamethoxazole-trimethoprim (SEPTRA DS) 800-160 MG per tablet Take 1 tablet by mouth every 12 (twelve) hours. 10 tablet 0   No current facility-administered medications for this visit.    ROS: Review of Systems   Objective:  Psychiatric Specialty Exam: There were no vitals taken for this visit.There is no height or weight on file to calculate BMI.  General Appearance: Well Groomed  Eye Contact: Good  Speech:  Clear and Coherent and Normal Rate  Volume:  Normal  Mood:  Euthymic  Affect:  Appropriate and Congruent  Thought Content: Logical   Suicidal Thoughts:  No  Homicidal Thoughts:  No  Thought Process:  Coherent, Goal Directed, and Linear  Orientation:  Full (Time, Place, and Person)    Memory: Remote;   Fair  Judgment:  Intact  Insight:  Fair  Concentration:  Concentration: Good  Recall: not formally assessed   Fund of Knowledge: Good  Language: Good  Psychomotor Activity:  Normal  Akathisia:  No  AIMS (if indicated): not done  Assets:  Communication Skills Desire for Improvement Financial Resources/Insurance Housing Intimacy Leisure Time Physical Health Resilience Social  Support Talents/Skills Transportation Vocational/Educational  ADL's:  Intact  Cognition: WNL  Sleep:  Good   PE: General: well-appearing; no acute distress  Pulm: no increased work of breathing on room air  Strength & Muscle Tone: within normal limits Neuro: no focal neurological deficits observed  Gait & Station: normal  Metabolic Disorder Labs: No results found for: "HGBA1C", "MPG" No results found for: "PROLACTIN" No results found for: "CHOL", "TRIG", "HDL", "CHOLHDL", "VLDL", "LDLCALC" No results found for: "TSH"  Therapeutic Level Labs: No results found for: "LITHIUM" No results found for: "VALPROATE" No results found for: "CBMZ"  Screenings: GAD-7    Flowsheet Row Office Visit from 10/18/2022 in [facility]  Total GAD-7 Score 4      PHQ2-9    Flowsheet Row Office Visit from 10/18/2022 in [place]  PHQ-2 Total Score 0      Flowsheet Row ED from 11/21/2022 in [facility] Emergency Department at [facility] Office  Visit from 10/18/2022 in [facility] ED from 10/17/2022 in [facility]  C-SSRS RISK CATEGORY No Risk No Risk No Risk       Collaboration of Care: Collaboration of Care:  Patient/Guardian was advised Release of Information must be obtained prior to any record release in order to collaborate their care with an outside provider. Patient/Guardian was advised if they have not already done so to contact the registration department to sign all necessary forms in order for [place] to release information regarding their care.   Consent: Patient/Guardian gives verbal consent for treatment and assignment of benefits for services provided during this visit. Patient/Guardian expressed understanding and agreed to proceed.   Park Pope, MD 12/06/2022, 8:46 AM

## 2022-12-06 ENCOUNTER — Ambulatory Visit (INDEPENDENT_AMBULATORY_CARE_PROVIDER_SITE_OTHER): Payer: No Payment, Other | Admitting: Student

## 2022-12-06 DIAGNOSIS — Z758 Other problems related to medical facilities and other health care: Secondary | ICD-10-CM | POA: Diagnosis not present

## 2022-12-06 DIAGNOSIS — F4322 Adjustment disorder with anxiety: Secondary | ICD-10-CM

## 2022-12-06 DIAGNOSIS — R0781 Pleurodynia: Secondary | ICD-10-CM | POA: Diagnosis not present

## 2022-12-06 MED ORDER — HYDROXYZINE HCL 10 MG PO TABS: 10.0000 mg | ORAL_TABLET | Freq: Two times a day (BID) | ORAL | 1 refills | Status: AC

## 2023-02-06 NOTE — Progress Notes (Deleted)
BH MD Outpatient Progress Note  02/06/2023 1:59 PM EMRIC NICOLO  MRN:  [id number]  Assessment:  [name] presents for follow-up evaluation in-person. Today, 02/06/23, patient reports tremendous improvement with hydroxyzine with no side effects. He declined starting a scheduled SSRI given present efficacy of hydroxyzine. Plan to continue current regiment and follow up in 2 months for continued monitoring.   Identifying Information: [name] is a 52 y.o. male with history of adjustment disorder with anxious mood who is an established patient with [facility] for medication management.   Plan:  # Adjustment disorder with anxious mood Past medication trials:  Status of problem: active Interventions: -- continue hydroxyzine 10 mg bid for anxiety -- continue ramelteon 2 mg at bedtime prn   Return to care in 2 months  Patient was given contact information for behavioral health clinic and was instructed to call 911 for emergencies.    Patient and plan of care will be discussed with the Attending MD, Dr. Lucianne Muss, who agrees with the above statement and plan.   Subjective:  Chief Complaint: Medication Management   Interval History: Patient presents for follow-up.  He was last seen by Otila Back 10/18/22 and was started on hydroxyzine as needed for anxiety.  He denies SI/HI/AVH.  Reports that his mood has significantly improved.  He feels like he is back to his normal self and is able to manage significantly stressful situations with appropriate and calm demeanor/behavior.  He reports wanting to continue on this medication for as long as possible given the significant benefits from it.  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 He reports sleep and appetite have been appropriate. He felt the full dose of ramelteon was way too  overwhelming so he is taking 0.25 of what is needed. He would like to follow-up with me for psychiatric needs.  He also reports some dental pain as well as rib pain related to possible dental abscess and rib contusion respectively. He reports decreasing his alcohol use from multiple times daily to 1 vodka cranberry a night.   Visit Diagnosis:  No diagnosis found.   Past Psychiatric History:  Diagnoses: Adjustment Disorder Medication trials: hydroxyzine Previous psychiatrist/therapist: none Hospitalizations: denies Suicide attempts: denies SIB: denies Hx of violence towards others: denies Current access to guns: denies Hx of trauma/abuse: denies Substance use: alcohol  Past Medical History:  No past medical history on file.  No past surgical history on file.  Family Psychiatric History: denies  Family History:  No family history on file.  Social History:  Academic/Vocational: electrician Social History   Socioeconomic History   Marital status: Single    Spouse name: Not on file   Number of children: Not on file   Years of education: Not on file   Highest education level: Not on file  Occupational History   Not on file  Tobacco Use   Smoking status: Every Day    Current packs/day: 0.50    Types: Cigarettes   Smokeless tobacco: Never  Substance and Sexual Activity   Alcohol use: Yes    Comment: heavy   Drug use: No   Sexual activity: Not on file  Other Topics Concern   Not on file  Social History Narrative   Not on file   Social Drivers of Health  Financial Resource Strain: Not on file  Food Insecurity: Not on file  Transportation Needs: Not on file  Physical Activity: Not on file  Stress: Not on file  Social Connections: Not on file    Allergies:  No Known Allergies  Current Medications: Current Outpatient Medications  Medication Sig Dispense Refill   cyclobenzaprine (FLEXERIL) 10 MG tablet Take 1 tablet (10 mg total) by mouth 2 (two) times daily  as needed for muscle spasms. 60 tablet 0   oxyCODONE-acetaminophen (PERCOCET/ROXICET) 5-325 MG tablet Take 1 tablet by mouth every 6 (six) hours as needed for severe pain. 10 tablet 0   predniSONE (DELTASONE) 10 MG tablet Take 2 tablets (20 mg total) by mouth daily. 16 tablet 0   ramelteon (ROZEREM) 8 MG tablet Take 1 tablet (8 mg total) by mouth at bedtime as needed for sleep. 30 tablet 1   sildenafil (VIAGRA) 100 MG tablet Take 100 mg by mouth daily as needed for erectile dysfunction.     sulfamethoxazole-trimethoprim (SEPTRA DS) 800-160 MG per tablet Take 1 tablet by mouth every 12 (twelve) hours. 10 tablet 0   No current facility-administered medications for this visit.    ROS: Review of Systems   Objective:  Psychiatric Specialty Exam: There were no vitals taken for this visit.There is no height or weight on file to calculate BMI.  General Appearance: Well Groomed  Eye Contact: Good  Speech:  Clear and Coherent and Normal Rate  Volume:  Normal  Mood:  Euthymic  Affect:  Appropriate and Congruent  Thought Content: Logical   Suicidal Thoughts:  No  Homicidal Thoughts:  No  Thought Process:  Coherent, Goal Directed, and Linear  Orientation:  Full (Time, Place, and Person)    Memory: Remote;   Fair  Judgment:  Intact  Insight:  Fair  Concentration:  Concentration: Good  Recall: not formally assessed   Fund of Knowledge: Good  Language: Good  Psychomotor Activity:  Normal  Akathisia:  No  AIMS (if indicated): not done  Assets:  Communication Skills Desire for Improvement Financial Resources/Insurance Housing Intimacy Leisure Time Physical Health Resilience Social Support Talents/Skills Transportation Vocational/Educational  ADL's:  Intact  Cognition: WNL  Sleep:  Good   PE: General: well-appearing; no acute distress  Pulm: no increased work of breathing on room air  Strength & Muscle Tone: within normal limits Neuro: no focal neurological deficits observed   Gait & Station: normal  Metabolic Disorder Labs: No results found for: "HGBA1C", "MPG" No results found for: "PROLACTIN" No results found for: "CHOL", "TRIG", "HDL", "CHOLHDL", "VLDL", "LDLCALC" No results found for: "TSH"  Therapeutic Level Labs: No results found for: "LITHIUM" No results found for: "VALPROATE" No results found for: "CBMZ"  Screenings: GAD-7    Flowsheet Row Office Visit from 10/18/2022 in [facility]  Total GAD-7 Score 4      PHQ2-9    Flowsheet Row Office Visit from 10/18/2022 in [facility]  PHQ-2 Total Score 0      Flowsheet Row ED from 11/21/2022 in [facility] Department at [facility] Office Visit from 10/18/2022 in [facility] ED from 10/17/2022 in [facility]  C-SSRS RISK CATEGORY No Risk No Risk No Risk       Collaboration of Care: Collaboration of Care:  Patient/Guardian was advised Release of Information must be obtained prior to any record release in order to collaborate their care with an outside provider. Patient/Guardian  was advised if they have not already done so to contact the registration department to sign all necessary forms in order for [place] to release information regarding their care.   Consent: Patient/Guardian gives verbal consent for treatment and assignment of benefits for services provided during this visit. Patient/Guardian expressed understanding and agreed to proceed.   Park Pope, MD 02/06/2023, 1:59 PM

## 2023-02-07 ENCOUNTER — Encounter (HOSPITAL_COMMUNITY): Payer: No Payment, Other | Admitting: Student
